# Patient Record
Sex: Female | Born: 1940 | Race: White | Hispanic: No | Marital: Married | Smoking: Former smoker
Health system: Southern US, Community
[De-identification: ages and names within clinical notes are randomized; demographics above are authoritative.]

## PROBLEM LIST (undated history)

## (undated) DIAGNOSIS — Z86010 Personal history of colonic polyps: Secondary | ICD-10-CM

## (undated) DIAGNOSIS — I10 Essential (primary) hypertension: Secondary | ICD-10-CM

## (undated) DIAGNOSIS — M2550 Pain in unspecified joint: Secondary | ICD-10-CM

## (undated) DIAGNOSIS — E079 Disorder of thyroid, unspecified: Secondary | ICD-10-CM

## (undated) DIAGNOSIS — H269 Unspecified cataract: Secondary | ICD-10-CM

## (undated) DIAGNOSIS — L120 Bullous pemphigoid: Secondary | ICD-10-CM

## (undated) HISTORY — DX: Disorder of thyroid, unspecified: E07.9

## (undated) HISTORY — DX: Unspecified cataract: H26.9

## (undated) HISTORY — DX: Essential (primary) hypertension: I10

## (undated) HISTORY — DX: Personal history of colonic polyps: Z86.010

## (undated) HISTORY — PX: RETINOPATHY SURGERY: SHX765

## (undated) HISTORY — DX: Pain in unspecified joint: M25.50

## (undated) HISTORY — DX: Bullous pemphigoid: L12.0

---

## 1979-03-24 HISTORY — PX: ABDOMINAL HYSTERECTOMY: SHX81

## 1980-03-23 HISTORY — PX: CHOLECYSTECTOMY: SHX55

## 2001-03-23 HISTORY — PX: CATARACT EXTRACTION: SUR2

## 2005-04-28 ENCOUNTER — Other Ambulatory Visit: Payer: Self-pay

## 2005-04-28 ENCOUNTER — Inpatient Hospital Stay: Payer: Self-pay | Admitting: Internal Medicine

## 2005-04-28 IMAGING — CR DG CHEST 1V PORT
1 series · 1 of 1 positions shown · non-contrast
Comparison: none

REASON FOR EXAM: Chest pain. Rm 19
COMMENTS:

PROCEDURE:     DXR - DXR PORTABLE CHEST SINGLE VIEW  - [DATE]  [DATE]
RESULT:     Portable view of the chest shows the heart is mildly enlarged.
Cardiac monitoring electrodes are present.  There is no infiltrate or
effusion.  There is no evidence of failure.

[view not recorded]
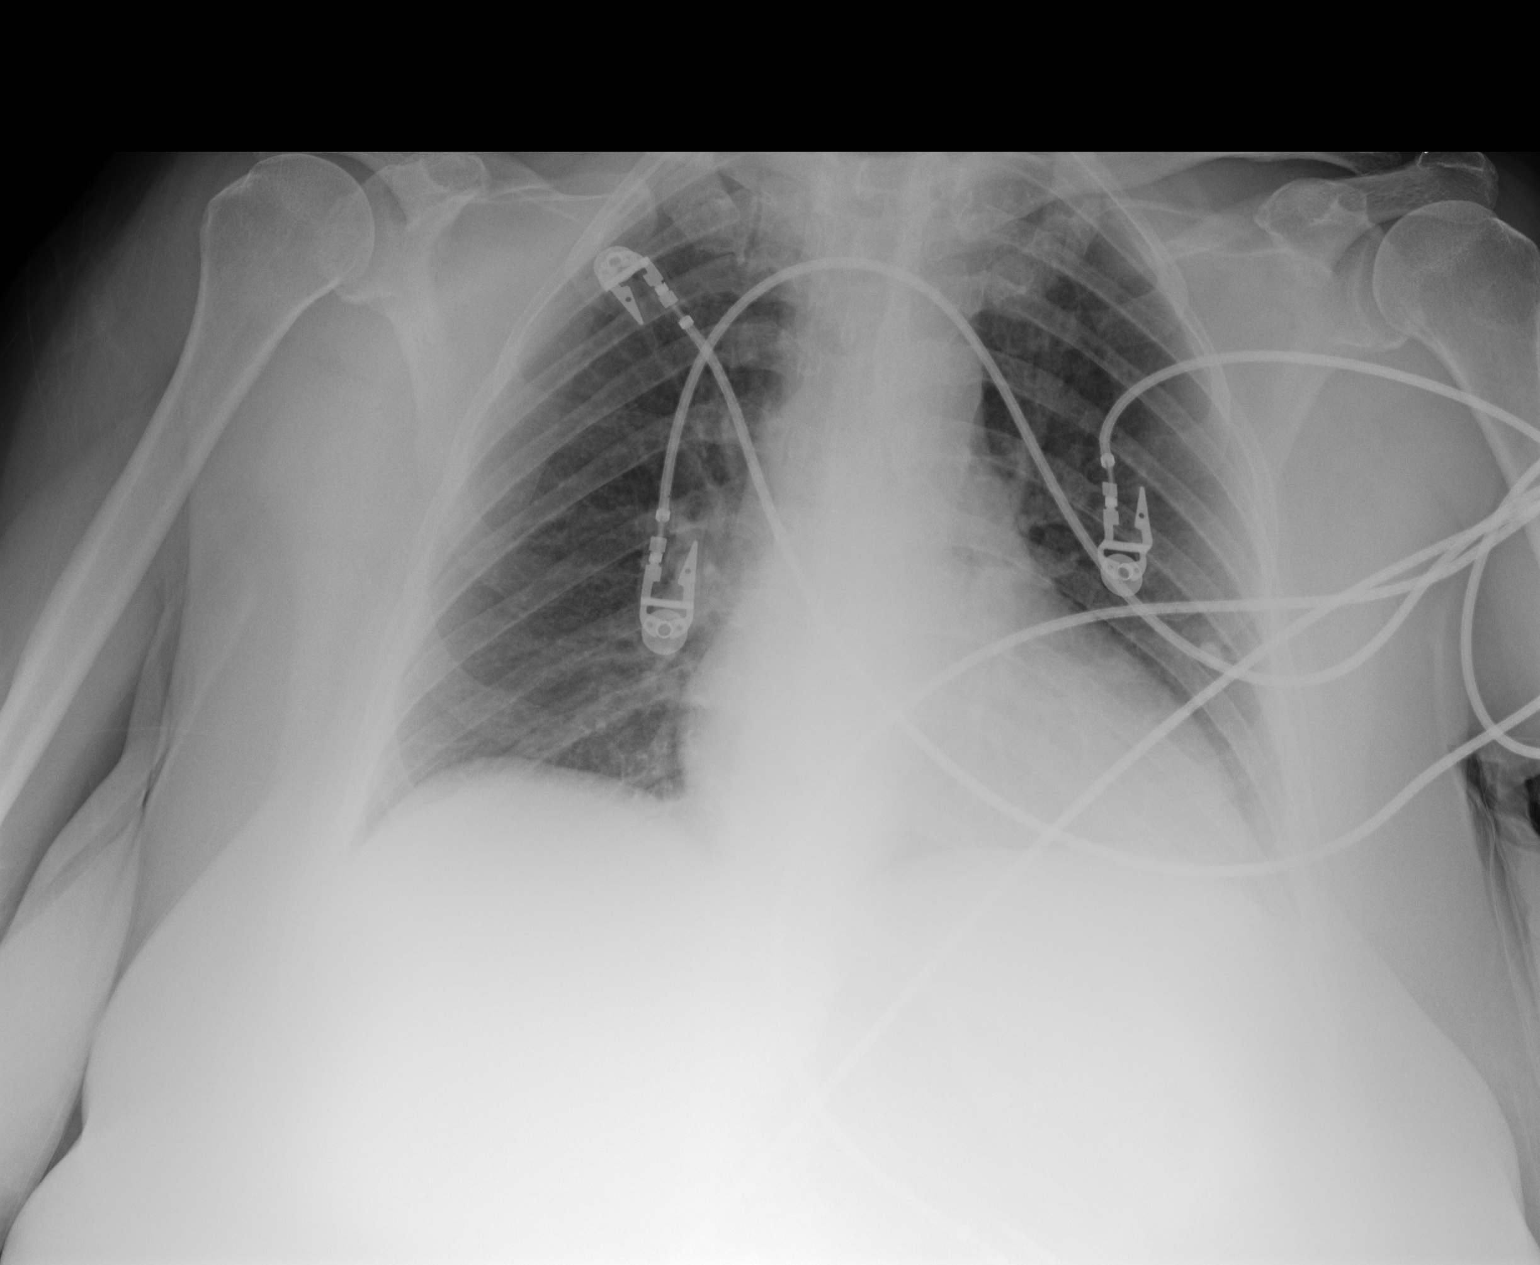

[1 of 1 positions shown; findings below may reference images not displayed]

IMPRESSION: No acute cardiopulmonary disease evident.

## 2005-10-27 ENCOUNTER — Ambulatory Visit: Payer: Self-pay | Admitting: Family Medicine

## 2009-02-20 ENCOUNTER — Ambulatory Visit: Payer: Self-pay | Admitting: Family Medicine

## 2011-11-19 ENCOUNTER — Ambulatory Visit (AMBULATORY_SURGERY_CENTER): Payer: Medicare Other | Admitting: *Deleted

## 2011-11-19 VITALS — Ht 64.0 in | Wt 178.7 lb

## 2011-11-19 DIAGNOSIS — Z1211 Encounter for screening for malignant neoplasm of colon: Secondary | ICD-10-CM

## 2011-11-19 MED ORDER — NA SULFATE-K SULFATE-MG SULF 17.5-3.13-1.6 GM/177ML PO SOLN: 1.0000 | Freq: Once | ORAL | Status: DC

## 2011-12-03 ENCOUNTER — Encounter: Payer: Self-pay | Admitting: *Deleted

## 2011-12-03 ENCOUNTER — Encounter: Payer: Self-pay | Admitting: Internal Medicine

## 2011-12-03 ENCOUNTER — Ambulatory Visit (AMBULATORY_SURGERY_CENTER): Payer: Medicare Other | Admitting: Internal Medicine

## 2011-12-03 VITALS — BP 151/75 | HR 51 | Temp 98.2°F | Resp 18 | Ht 64.0 in | Wt 178.0 lb

## 2011-12-03 DIAGNOSIS — K5520 Angiodysplasia of colon without hemorrhage: Secondary | ICD-10-CM

## 2011-12-03 DIAGNOSIS — Z1211 Encounter for screening for malignant neoplasm of colon: Secondary | ICD-10-CM

## 2011-12-03 DIAGNOSIS — Z860100 Personal history of colon polyps, unspecified: Secondary | ICD-10-CM

## 2011-12-03 DIAGNOSIS — D126 Benign neoplasm of colon, unspecified: Secondary | ICD-10-CM

## 2011-12-03 DIAGNOSIS — K5730 Diverticulosis of large intestine without perforation or abscess without bleeding: Secondary | ICD-10-CM

## 2011-12-03 DIAGNOSIS — K635 Polyp of colon: Secondary | ICD-10-CM

## 2011-12-03 HISTORY — DX: Personal history of colonic polyps: Z86.010

## 2011-12-03 HISTORY — DX: Personal history of colon polyps, unspecified: Z86.0100

## 2011-12-03 LAB — HM COLONOSCOPY

## 2011-12-03 MED ORDER — SODIUM CHLORIDE 0.9 % IV SOLN: 500.0000 mL | INTRAVENOUS | Status: DC

## 2011-12-03 NOTE — Progress Notes (Signed)
Patient did not have preoperative order for IV antibiotic SSI prophylaxis. (G8918)  Patient did not experience any of the following events: a burn prior to discharge; a fall within the facility; wrong site/side/patient/procedure/implant event; or a hospital transfer or hospital admission upon discharge from the facility. (G8907)  

## 2011-12-03 NOTE — Op Note (Addendum)
[facility] [address] [place], [address]   COLONOSCOPY PROCEDURE REPORT  PATIENT: Tracie Gray, Tracie Gray  MR#: [id number] BIRTHDATE: [birth year] , 70  yrs. old GENDER: Female ENDOSCOPIST: Iva Boop, MD, [facility] REFERRED XB:JYNWG Elease Hashimoto, M.D. PROCEDURE DATE:  12/03/2011 PROCEDURE:   Colonoscopy with snare polypectomy ASA CLASS:   Class III INDICATIONS:average risk screening. MEDICATIONS: propofol (Diprivan) 150mg  IV, MAC sedation, administered by CRNA, and These medications were titrated to patient response per physician's verbal order  DESCRIPTION OF PROCEDURE:   After the risks benefits and alternatives of the procedure were thoroughly explained, informed consent was obtained.  A digital rectal exam revealed no abnormalities of the rectum.   The LB CF-[id number]  endoscope was introduced through the anus and advanced to the cecum, which was identified by both the appendix and ileocecal valve. No adverse events experienced.   The quality of the prep was Suprep excellent The instrument was then slowly withdrawn as the colon was fully examined.      COLON FINDINGS: Four polypoid shaped sessile polyps measuring 3-12 mm in size were found at the cecum, in the transverse colon, and at the splenic flexure.  A polypectomy was performed with a cold snare and using snare cautery (12 mm splenic flexure polyp).  The resection was complete and the polyp tissue was completely retrieved.   There was severe diverticulosis noted in the sigmoid colon with associated luminal narrowing and muscular hypertrophy. 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 The colon mucosa was otherwise normal.  Retroflexed views revealed no abnormalities. The time to cecum=3 minutes 03 seconds. Withdrawal time=16 minutes 50 seconds.  The scope was withdrawn and the procedure completed. COMPLICATIONS: There were no complications.  ENDOSCOPIC IMPRESSION: 1.    Four sessile polyps measuring 3-12 mm in size were found at the cecum, in the transverse colon, and at the splenic flexure; polypectomy was performed with a cold snare and using snare cautery  2.   There was severe diverticulosis noted in the sigmoid colon 3.    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   The colon mucosa was otherwise normal with excellent prep   RECOMMENDATIONS: 1.  Hold aspirin, aspirin products, and anti-inflammatory medication for 2 weeks. 2.  Timing of repeat colonoscopy will be determined by pathology findings and health status in future   eSigned:  Iva Boop, MD, [facility] 12/03/2011 10:05 AM Revised: 12/03/2011 10:05 AM  cc: Lorie Phenix, MD and The Patient   PATIENT NAME:  Tracie Gray, Tracie Gray MR#: [id number]

## 2011-12-03 NOTE — Patient Instructions (Addendum)
The colonoscopy showed 4 polyps that were removed. They all look benign but will be analyzed by the pathologist.  You also have diverticulosis - common condition that usually does not cause problems.  There were also some small AVM's (arteriovenous malformations also called angiodysplasia) - in the colon. These were not bleeding and will probably never bother you. If they do they usually can be handled with colonoscopy treatment.  Please read the handouts.  Thank you for choosing me and [facility].  Iva Boop, MD, FACG   YOU HAD AN ENDOSCOPIC PROCEDURE TODAY AT THE [facility]: Refer to the procedure report that was given to you for any specific questions about what was found during the examination.  If the procedure report does not answer your questions, please call your gastroenterologist to clarify.  If you requested that your care partner not be given the details of your procedure findings, then the procedure report has been included in a sealed envelope for you to review at your convenience later.  YOU SHOULD EXPECT: Some feelings of bloating in the abdomen. Passage of more gas than usual.  Walking can help get rid of the air that was put into your GI tract during the procedure and reduce the bloating. If you had a lower endoscopy (such as a colonoscopy or flexible sigmoidoscopy) you may notice spotting of blood in your stool or on the toilet paper. If you underwent a bowel prep for your procedure, then you may not have a normal bowel movement for a few days.  DIET: Your first meal following the procedure should be a light meal and then it is ok to progress to your normal diet.  A half-sandwich or bowl of soup is an example of a good first meal.  Heavy or fried foods are harder to digest and may make you feel nauseous or bloated.  Likewise meals heavy in dairy and vegetables can cause extra gas to form and this can also increase the bloating.  Drink plenty of  fluids but you should avoid alcoholic beverages for 24 hours.  ACTIVITY: Your care partner should take you home directly after the procedure.  You should plan to take it easy, moving slowly for the rest of the day.  You can resume normal activity the day after the procedure however you should NOT DRIVE or use heavy machinery for 24 hours (because of the sedation medicines used during the test).    SYMPTOMS TO REPORT IMMEDIATELY: A gastroenterologist can be reached at any hour.  During normal business hours, 8:30 AM to 5:00 PM Monday through Friday, call [phone].  After hours and on weekends, please call the GI answering service at [phone] who will take a message and have the physician on call contact you.   Following lower endoscopy (colonoscopy or flexible sigmoidoscopy):  Excessive amounts of blood in the stool  Significant tenderness or worsening of abdominal pains  Swelling of the abdomen that is new, acute  Fever of 100F or higher  FOLLOW UP: If any biopsies were taken you will be contacted by phone or by letter within the next 1-3 weeks.  Call your gastroenterologist if you have not heard about the biopsies in 3 weeks.  Our staff will call the home number listed on your records the next business day following your procedure to check on you and address any questions or concerns that you may have at that time regarding the information given to you following your procedure. This  is a courtesy call and so if there is no answer at the home number and we have not heard from you through the emergency physician on call, we will assume that you have returned to your regular daily activities without incident.  SIGNATURES/CONFIDENTIALITY: You and/or your care partner have signed paperwork which will be entered into your electronic medical record.  These signatures attest to the fact that that the information above on your After Visit Summary has been reviewed and is understood.  Full  responsibility of the confidentiality of this discharge information lies with you and/or your care-partner.    HANDOUTS ON POLYPS, DIVERTICULOSIS, HIGH FIBER DIET  NO ASPIRIN, ASPIRIN CONTAINING MEDICINES, NSAIDS LIKE MOTRIN, ADVIL ALEVE FOR 2 WEEKS. TYLENOL ONLY AS NEEDED FOR PAIN. MAY RESUME THE BABY ASPIRIN ON 12-17-11

## 2011-12-04 ENCOUNTER — Telehealth: Payer: Self-pay

## 2011-12-04 NOTE — Telephone Encounter (Signed)
Left a message at [phone] for the pt to call if any questions or concerns. Maw

## 2011-12-08 ENCOUNTER — Encounter: Payer: Self-pay | Admitting: Internal Medicine

## 2011-12-08 NOTE — Progress Notes (Signed)
Quick Note:  4 tubular adenomas max 12 mm Repeat colon about 11/2014 ______

## 2013-06-06 ENCOUNTER — Telehealth: Payer: Self-pay | Admitting: Pulmonary Disease

## 2013-06-06 ENCOUNTER — Ambulatory Visit: Payer: Self-pay | Admitting: Family Medicine

## 2013-06-06 LAB — CBC AND DIFFERENTIAL: WBC: 6.5 10^3/mL

## 2013-06-06 NOTE — Telephone Encounter (Signed)
Spoke with sarah. appt scheduled for 06/27/13. Nothing further needed

## 2013-06-27 ENCOUNTER — Encounter: Payer: Self-pay | Admitting: Pulmonary Disease

## 2013-06-27 ENCOUNTER — Ambulatory Visit (INDEPENDENT_AMBULATORY_CARE_PROVIDER_SITE_OTHER): Payer: Commercial Managed Care - HMO | Admitting: Pulmonary Disease

## 2013-06-27 VITALS — BP 136/76 | HR 58 | Ht 62.5 in | Wt 170.0 lb

## 2013-06-27 DIAGNOSIS — R0602 Shortness of breath: Secondary | ICD-10-CM

## 2013-06-27 NOTE — Patient Instructions (Signed)
We will request a copy of your clinic visit and echocardiogram from Dr. Hortense Ramal office We will set up a pulmonary function test at [facility] Call Dr. Sharyon Medicus office to discuss your blood sugar If all tests are normal then start exercising regularly. We will see you back in 4-6 weeks or sooner if needed

## 2013-06-27 NOTE — Assessment & Plan Note (Signed)
Today her lung exam and her ambulatory oximetry were completely normal. Based on this I don't see clear evidence of lung disease. I will get a chest x-ray and full pulmonary function test to further evaluate. Am happy that her recent hemoglobin was normal.  It sounds like her recent cardiac workup was also normal. We will need to get workup for this.  Her neurologic exam is normal and so that is also unlikely to explain her shortness of breath.  In summary, at this point and finding little evidence to explain her shortness of breath other than deconditioning and being overweight.  Plan:  -full pulmonary function testing -chest x-ray  -get cardiology records -Followup 4 weeks

## 2013-06-27 NOTE — Progress Notes (Signed)
Subjective:    Patient ID: Tracie Gray, female    DOB: 02/24/1941, 73 y.o.   MRN: [id number]  HPI  06/27/2013 Initial visit >>  Ms. Tracie Gray is a 73 y/o female here to see [place] today for shortness of breath.  She has been seen by her primary care physician for this recently.  She said that she noted some ankle swelling lately and so she saw her PCP.  On that visit she was noted to have a low oxygen saturation.  Notably her ankle swelling had impreoved by that visit.  She states that the problem comes and goes and doesn't bother her as much as it bothers her husband.  It was more of a problem when she was visiting family in [place] last summer.    She has noted dyspnea on exertion with cleaning the house, walking to the mailbox.  She has noted that her energy level is lower than before and she has to take a break often.  Walking outside brings on her dyspnea predictably.  She notes that she can only really walk about 40-50 feet without dyspnea.  Her husband has started doing more vigorous house work and shopping trips for her because of this.  She has noted the problem for several months and just attributed it to age.    She denies chest pain.  She will rarely have chest tightness.  She notes that she had a stress test in the last 6 months which was normal.  She sees Dr. Satira Anis for this.  She denies cough.    She has never been told that she had a lung problem before.  She was noted to have scarring on her lungs on a recent chest X-ray which she attributes to a bad spell of bronchitis about 7-8 years ago.  Childhood was normal without respiratory problems.  She smoked cigarettes for a few months after marriage and before her daughters.    Past Medical History  Diagnosis Date  . Arthritic-like pain   . Bilateral cataracts   . Hypertension   . Thyroid disease   . 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 Personal history of colonic polyps-adenomas 12/03/2011      Family History  Problem Relation Age of Onset  . Colon cancer Neg Hx   . Rectal cancer Neg Hx   . Stomach cancer Neg Hx   . Esophageal cancer Neg Hx      History   Social History  . Marital Status: Married    Spouse Name: N/A    Number of Children: N/A  . Years of Education: N/A   Occupational History  . Not on file.   Social History Main Topics  . Smoking status: Former Smoker -- 1.00 packs/day for 1 years    Types: Cigarettes    Quit date: 03/23/1962  . Smokeless tobacco: Never Used  . Alcohol Use: No  . Drug Use: No  . Sexual Activity: Not on file   Other Topics Concern  . Not on file   Social History Narrative  . No narrative on file     Allergies  Allergen Reactions  . Calcium Channel Blockers Rash  . Norvasc [Amlodipine Besylate] Rash  . Penicillins Rash     Outpatient Prescriptions Prior to Visit  Medication Sig Dispense Refill  . aspirin 81 MG tablet Take 81 mg by mouth daily.      Marland Kitchen atenolol (TENORMIN) 25 MG tablet Take  25 mg by mouth 2 (two) times daily.       . cloNIDine (CATAPRES) 0.1 MG tablet Take 0.1 mg by mouth 2 (two) times daily.       . hydrochlorothiazide (HYDRODIURIL) 25 MG tablet Take 25 mg by mouth daily.        No facility-administered medications prior to visit.      Review of Systems  Constitutional: Negative for fever and unexpected weight change.  HENT: Positive for sore throat. Negative for congestion, dental problem, ear pain, nosebleeds, postnasal drip, rhinorrhea, sinus pressure, sneezing and trouble swallowing.   Eyes: Negative for redness and itching.  Respiratory: Positive for shortness of breath and wheezing. Negative for cough and chest tightness.   Cardiovascular: Positive for leg swelling. Negative for palpitations.  Gastrointestinal: Negative for nausea and vomiting.  Genitourinary: Negative for dysuria.  Musculoskeletal: Negative for joint swelling.  Skin: Negative for rash.  Neurological: Negative for  headaches.  Hematological: Does not bruise/bleed easily.  Psychiatric/Behavioral: Negative for dysphoric mood. The patient is not nervous/anxious.        Objective:   Physical Exam Filed Vitals:   06/27/13 0913  BP: 136/76  Pulse: 58  Height: 5' 2.5" (1.588 m)  Weight: 170 lb (77.111 kg)  SpO2: 97%   Ambulated 500 feet RA and O2 saturation did no drop below 95%  Gen: well appearing, no acute distress HEENT: NCAT, PERRL, EOMi, OP clear, neck supple without masses PULM: CTA B CV: RRR, no mgr, no JVD AB: BS+, soft, nontender, no hsm Ext: warm, no edema, no clubbing, no cyanosis Derm: no rash or skin breakdown Neuro: A&Ox4, CN II-XII intact, strength 5/5 in all 4 extremities        Assessment & Plan:   Shortness of breath Today her lung exam and her ambulatory oximetry were completely normal. Based on this I don't see clear evidence of lung disease. I will get a chest x-ray and full pulmonary function test to further evaluate. Am happy that her recent hemoglobin was normal.  It sounds like her recent cardiac workup was also normal. We will need to get workup for this.  Her neurologic exam is normal and so that is also unlikely to explain her shortness of breath.  In summary, at this point and finding little evidence to explain her shortness of breath other than deconditioning and being overweight.  Plan:  -full pulmonary function testing -chest x-ray  -get cardiology records -Followup 4 weeks   Updated Medication List Outpatient Encounter Prescriptions as of 06/27/2013  Medication Sig  . aspirin 81 MG tablet Take 81 mg by mouth daily.  Marland Kitchen atenolol (TENORMIN) 25 MG tablet Take 25 mg by mouth 2 (two) times daily.   . Cholecalciferol (VITAMIN D-3) 5000 UNITS TABS Take 1 tablet by mouth every 7 (seven) days.  . clobetasol cream (TEMOVATE) 9.21 % Apply 1 application topically 2 (two) times daily.  . cloNIDine (CATAPRES) 0.1 MG tablet Take 0.1 mg by mouth 2 (two) times  daily.   . hydrochlorothiazide (HYDRODIURIL) 25 MG tablet Take 25 mg by mouth daily.

## 2013-07-04 ENCOUNTER — Encounter: Payer: Self-pay | Admitting: Pulmonary Disease

## 2013-07-11 ENCOUNTER — Ambulatory Visit: Payer: Self-pay | Admitting: Pulmonary Disease

## 2013-07-11 LAB — PULMONARY FUNCTION TEST

## 2013-07-17 ENCOUNTER — Telehealth: Payer: Self-pay

## 2013-07-17 ENCOUNTER — Encounter: Payer: Self-pay | Admitting: Pulmonary Disease

## 2013-07-17 DIAGNOSIS — R0602 Shortness of breath: Secondary | ICD-10-CM

## 2013-07-17 NOTE — Telephone Encounter (Signed)
Message copied by Len Blalock on Mon Jul 17, 2013  1:31 PM ------      Message from: Juanito Doom      Created: Tue Jun 27, 2013 10:34 AM       A,            Please call her in a month to remind her to get blood work at our office on May 4            Thanks      B ------

## 2013-07-17 NOTE — Telephone Encounter (Signed)
Dr. Lake Bells, This patient doesn't have any future orders for labs.  What labs do I need to put in for her to have drawn the day before her visit?  Thanks, Caryl Pina

## 2013-07-18 ENCOUNTER — Encounter: Payer: Self-pay | Admitting: Pulmonary Disease

## 2013-07-18 NOTE — Telephone Encounter (Signed)
Cbc, thanks

## 2013-07-18 NOTE — Telephone Encounter (Signed)
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   Thanks  B   ---------------------------------------------------   Lab has been ordered, pt has been reminded to have that drawn a couple of days prior to her visit.  She is also aware of her pft results.  Nothing further at this time.

## 2013-07-21 ENCOUNTER — Other Ambulatory Visit (INDEPENDENT_AMBULATORY_CARE_PROVIDER_SITE_OTHER): Payer: Commercial Managed Care - HMO

## 2013-07-21 DIAGNOSIS — R0602 Shortness of breath: Secondary | ICD-10-CM

## 2013-07-21 LAB — CBC WITH DIFFERENTIAL/PLATELET
BASOS PCT: 0.4 % (ref 0.0–3.0)
Basophils Absolute: 0 10*3/uL (ref 0.0–0.1)
EOS PCT: 5 % (ref 0.0–5.0)
Eosinophils Absolute: 0.4 10*3/uL (ref 0.0–0.7)
HEMATOCRIT: 40.1 % (ref 36.0–46.0)
HEMOGLOBIN: 13.6 g/dL (ref 12.0–15.0)
LYMPHS ABS: 2.8 10*3/uL (ref 0.7–4.0)
Lymphocytes Relative: 39.1 % (ref 12.0–46.0)
MCHC: 33.9 g/dL (ref 30.0–36.0)
MCV: 98 fl (ref 78.0–100.0)
MONOS PCT: 5.3 % (ref 3.0–12.0)
Monocytes Absolute: 0.4 10*3/uL (ref 0.1–1.0)
NEUTROS ABS: 3.6 10*3/uL (ref 1.4–7.7)
Neutrophils Relative %: 50.2 % (ref 43.0–77.0)
Platelets: 243 10*3/uL (ref 150.0–400.0)
RBC: 4.1 Mil/uL (ref 3.87–5.11)
RDW: 14.2 % (ref 11.5–14.6)
WBC: 7.1 10*3/uL (ref 4.5–10.5)

## 2013-07-25 ENCOUNTER — Encounter: Payer: Self-pay | Admitting: Pulmonary Disease

## 2013-07-25 ENCOUNTER — Ambulatory Visit (INDEPENDENT_AMBULATORY_CARE_PROVIDER_SITE_OTHER): Payer: Commercial Managed Care - HMO | Admitting: Pulmonary Disease

## 2013-07-25 VITALS — BP 138/62 | HR 59 | Ht 63.5 in | Wt 180.0 lb

## 2013-07-25 DIAGNOSIS — R0602 Shortness of breath: Secondary | ICD-10-CM

## 2013-07-25 NOTE — Progress Notes (Signed)
   Subjective:    Patient ID: Tracie Gray, female    DOB: March 10, 1941, 73 y.o.   MRN: [id number]  Synopsis: Referred to [facility] pulmonary in 2015 for shortness of breath. Never smoked cigarettes, never given a previous diagnosis of lung disease. 01/2013 Nuclear Stress Test> Normal treadmill test without evidence of ischemia or dysrhythmia; LVEF 68% 01/2013 Echo > LVEF 60%, limited views; RV size and function normal, normal atria 06/2013 PFT > Ratio 84%, FEV1 1.70 L (92% pred, -5% change with BD), TLC 3.48L (76% pred), ERV 0.11 (12% pred), DLCO 13.1 (65% pred)  Dyspnea was felt to be due to deconditioning and obesity.  HPI  07/25/2013 ROV > Since the last visit Tracie Gray has contineud to experience soe shortness of breath.  It hasn't really worsened since the last visit.  She as ablee to garden with her husband last weekend wihtout too much trouble.  Rare cough.  She has been trying to exercise a little more.  She really hsn't been too active with her hobbies as they are all fairly sedentary.  She has been active withwork around the house however.   Past Medical History  Diagnosis Date  . Arthritic-like pain   . Bilateral cataracts   . Hypertension   . Thyroid disease   . 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 Personal history of colonic polyps-adenomas 12/03/2011     Review of Systems     Objective:   Physical Exam  Filed Vitals:   07/25/13 1045  BP: 138/62  Pulse: 59  Height: 5' 3.5" (1.613 m)  Weight: 180 lb (81.647 kg)  SpO2: 100%   RA  Gen: well appearing, no acute distress HEENT: NCAT,  EOMi, OP clear,  PULM: CTA B CV: RRR, no mgr, no JVD AB: BS+, soft, nontender, no hsm Ext: warm, no edema, no clubbing, no cyanosis Derm: no rash or skin breakdown        Assessment & Plan:   Shortness of breath Her pulmonary function testing only showed mild her sugar disease which was consistent with obesity. The slight decrease in  her diffusion capacity is consistent with this restriction.  So in summary, I cannot find evidence for lung disease. Considering her normal cardiac workup I think it would be safe and is strongly encouraged for her to start exercising on a regular basis. The most likely etiology for her shortness of breath is deconditioning.  Plan: -Encourage regular exercise -Followup with me and shortness of breath worsens    Updated Medication List Outpatient Encounter Prescriptions as of 07/25/2013  Medication Sig  . aspirin 81 MG tablet Take 81 mg by mouth daily.  Marland Kitchen atenolol (TENORMIN) 25 MG tablet Take 25 mg by mouth 2 (two) times daily.   . Cholecalciferol (VITAMIN D-3) 5000 UNITS TABS Take 1 tablet by mouth every 7 (seven) days.  . clobetasol cream (TEMOVATE) 5.36 % Apply 1 application topically 2 (two) times daily.  . cloNIDine (CATAPRES) 0.1 MG tablet Take 0.1 mg by mouth 2 (two) times daily.   . hydrochlorothiazide (HYDRODIURIL) 25 MG tablet Take 25 mg by mouth daily.

## 2013-07-25 NOTE — Patient Instructions (Signed)
Exercise regularly.  Start with small goals (ten minutes a day) and increase to 25 minutes a day We will see you back if the breathing problem worsens

## 2013-07-25 NOTE — Assessment & Plan Note (Signed)
Her pulmonary function testing only showed mild her sugar disease which was consistent with obesity. The slight decrease in her diffusion capacity is consistent with this restriction.  So in summary, I cannot find evidence for lung disease. Considering her normal cardiac workup I think it would be safe and is strongly encouraged for her to start exercising on a regular basis. The most likely etiology for her shortness of breath is deconditioning.  Plan: -Encourage regular exercise -Followup with me and shortness of breath worsens

## 2013-08-30 ENCOUNTER — Encounter: Payer: Self-pay | Admitting: Pulmonary Disease

## 2013-11-30 ENCOUNTER — Ambulatory Visit: Payer: Self-pay | Admitting: Family Medicine

## 2013-11-30 LAB — HM DEXA SCAN

## 2013-11-30 LAB — HM MAMMOGRAPHY

## 2013-12-07 ENCOUNTER — Ambulatory Visit: Payer: Self-pay | Admitting: Family Medicine

## 2013-12-21 ENCOUNTER — Ambulatory Visit: Payer: Self-pay | Admitting: Family Medicine

## 2013-12-27 LAB — TSH: TSH: 1.74 u[IU]/mL (ref <=5.90)

## 2013-12-27 LAB — HEPATIC FUNCTION PANEL
ALT: 23 U/L (ref 7–35)
AST: 21 U/L (ref 13–35)

## 2013-12-27 LAB — BASIC METABOLIC PANEL
BUN: 14 mg/dL (ref 4–21)
CREATININE: 1.2 mg/dL — AB (ref <=1.1)
Glucose: 119 mg/dL
POTASSIUM: 3.6 mmol/L (ref 3.4–5.3)
SODIUM: 142 mmol/L (ref 137–147)

## 2014-01-21 ENCOUNTER — Ambulatory Visit: Payer: Self-pay | Admitting: Family Medicine

## 2014-02-01 DIAGNOSIS — E1122 Type 2 diabetes mellitus with diabetic chronic kidney disease: Secondary | ICD-10-CM | POA: Insufficient documentation

## 2014-02-01 DIAGNOSIS — E119 Type 2 diabetes mellitus without complications: Secondary | ICD-10-CM | POA: Insufficient documentation

## 2014-03-30 LAB — HEMOGLOBIN A1C: HEMOGLOBIN A1C: 6 % (ref 4.0–6.0)

## 2014-07-27 DIAGNOSIS — R609 Edema, unspecified: Secondary | ICD-10-CM | POA: Insufficient documentation

## 2014-07-27 DIAGNOSIS — I10 Essential (primary) hypertension: Secondary | ICD-10-CM | POA: Insufficient documentation

## 2014-07-27 DIAGNOSIS — I129 Hypertensive chronic kidney disease with stage 1 through stage 4 chronic kidney disease, or unspecified chronic kidney disease: Secondary | ICD-10-CM | POA: Insufficient documentation

## 2014-07-27 DIAGNOSIS — E559 Vitamin D deficiency, unspecified: Secondary | ICD-10-CM | POA: Insufficient documentation

## 2014-07-27 DIAGNOSIS — L120 Bullous pemphigoid: Secondary | ICD-10-CM | POA: Insufficient documentation

## 2014-07-27 DIAGNOSIS — R7981 Abnormal blood-gas level: Secondary | ICD-10-CM | POA: Insufficient documentation

## 2014-07-27 DIAGNOSIS — N183 Chronic kidney disease, stage 3 unspecified: Secondary | ICD-10-CM | POA: Insufficient documentation

## 2014-07-27 DIAGNOSIS — G64 Other disorders of peripheral nervous system: Secondary | ICD-10-CM | POA: Insufficient documentation

## 2014-07-27 DIAGNOSIS — G629 Polyneuropathy, unspecified: Secondary | ICD-10-CM | POA: Insufficient documentation

## 2014-09-04 ENCOUNTER — Encounter: Payer: Self-pay | Admitting: *Deleted

## 2014-09-28 ENCOUNTER — Ambulatory Visit (INDEPENDENT_AMBULATORY_CARE_PROVIDER_SITE_OTHER): Payer: PPO | Admitting: Family Medicine

## 2014-09-28 ENCOUNTER — Encounter: Payer: Self-pay | Admitting: Family Medicine

## 2014-09-28 VITALS — BP 128/70 | HR 56 | Temp 97.6°F | Resp 16 | Ht 64.5 in | Wt 164.0 lb

## 2014-09-28 DIAGNOSIS — E559 Vitamin D deficiency, unspecified: Secondary | ICD-10-CM

## 2014-09-28 DIAGNOSIS — E785 Hyperlipidemia, unspecified: Secondary | ICD-10-CM | POA: Insufficient documentation

## 2014-09-28 DIAGNOSIS — E7800 Pure hypercholesterolemia, unspecified: Secondary | ICD-10-CM

## 2014-09-28 DIAGNOSIS — N183 Chronic kidney disease, stage 3 unspecified: Secondary | ICD-10-CM

## 2014-09-28 DIAGNOSIS — I129 Hypertensive chronic kidney disease with stage 1 through stage 4 chronic kidney disease, or unspecified chronic kidney disease: Secondary | ICD-10-CM | POA: Diagnosis not present

## 2014-09-28 DIAGNOSIS — I10 Essential (primary) hypertension: Secondary | ICD-10-CM | POA: Diagnosis not present

## 2014-09-28 DIAGNOSIS — E119 Type 2 diabetes mellitus without complications: Secondary | ICD-10-CM

## 2014-09-28 LAB — POCT GLYCOSYLATED HEMOGLOBIN (HGB A1C): HEMOGLOBIN A1C: 5.8

## 2014-09-28 MED ORDER — HYDROCHLOROTHIAZIDE 25 MG PO TABS: 25.0000 mg | ORAL_TABLET | Freq: Every day | ORAL | Status: DC

## 2014-09-28 NOTE — Progress Notes (Signed)
Subjective:    Patient ID: Tracie Gray, female    DOB: March 06, 1941, 74 y.o.   MRN: [id number]  Hypertension This is a chronic problem. The problem is unchanged. The problem is controlled (Pt checks her BP at home occasionally.  It usually runs around 140's/ 80's). Pertinent negatives include no blurred vision, chest pain, headaches, malaise/fatigue, neck pain, palpitations, peripheral edema, PND or shortness of breath. Risk factors for coronary artery disease include diabetes mellitus and dyslipidemia.  Diabetes She presents for her follow-up diabetic visit. She has type 2 diabetes mellitus. Her disease course has been stable (Last A1C was 03/30/2014:  6.0%). There are no hypoglycemic associated symptoms. Pertinent negatives for hypoglycemia include no dizziness, headaches, seizures, speech difficulty or tremors. Associated symptoms include foot paresthesias. Pertinent negatives for diabetes include no blurred vision, no chest pain, no fatigue, no foot ulcerations, no polydipsia, no polyphagia, no polyuria, no visual change, no weakness and no weight loss. Symptoms are stable. Risk factors for coronary artery disease include diabetes mellitus, dyslipidemia and hypertension. She is compliant with treatment all of the time. Her weight is stable. There is no change in her home blood glucose trend. Her overall blood glucose range is 130-140 mg/dl. She does not see a podiatrist.Eye exam is not current (May have been a year ago.  ).  Hyperlipidemia This is a chronic problem. The problem is controlled. Recent lipid tests were reviewed and are high (Last Cholesterol was 10/11/2013, Total Cholesterol:  202; Tri:  192; HDL:  44; LDL:  120.). Associated symptoms include myalgias (Feet hurt occasionally.). Pertinent negatives include no chest pain or shortness of breath. There are no compliance problems.  Risk factors for coronary artery disease include diabetes mellitus and hypertension.    Patient Active  Problem List   Diagnosis Date Noted  . Elevated LDL cholesterol level 09/28/2014  . Benign hypertension 07/27/2014  . Adult BMI 30+ 07/27/2014  . ABG (arterial blood gas) abnormal 07/27/2014  . 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 Chronic kidney disease (CKD), stage III (moderate) 07/27/2014  . Diabetes 07/27/2014  . Accumulation of fluid in tissues 07/27/2014  . Vitamin D deficiency 07/27/2014  . Neuropathy 07/27/2014  . 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 Shortness of breath 06/27/2013  . Personal history of colonic polyps-adenomas 12/03/2011  . Angiodysplasia of cecum 12/03/2011   Family History  Problem Relation Age of Onset  . Colon cancer Neg Hx   . Rectal cancer Neg Hx   . Stomach cancer Neg Hx   . Esophageal cancer Neg Hx   . Cancer Mother   . Coronary artery disease Father   . Heart disease Father   . Heart attack Father   . Heart attack Paternal Grandfather    History   Social History  . Marital Status: Married    Spouse Name: Fritz Pickerel  . Number of Children: 3  . Years of Education: N/A   Occupational History  . Not on file.   Social History Main Topics  . Smoking status: Former Smoker -- 1.00 packs/day for 1 years    Types: Cigarettes    Quit date: 03/23/1962  . Smokeless tobacco: Never Used  . Alcohol Use: No  . Drug Use: No  . Sexual Activity: Not on file   Other Topics Concern  . Not on file   Social History Narrative   Past Surgical History  Procedure Laterality Date  . Cataract extraction Bilateral 2003  . Cholecystectomy  1982  .  Abdominal hysterectomy  1981  . Cesarean section  1981  . Retinopathy surgery Right    Allergies  Allergen Reactions  . Calcium Channel Blockers Rash  . Norvasc [Amlodipine Besylate] Rash  . Penicillins Rash   Previous Medications   ASPIRIN 81 MG TABLET    Take 81 mg by mouth daily.   ATENOLOL (TENORMIN) 25 MG TABLET    Take by mouth.   CHOLECALCIFEROL (VITAMIN D-3) 5000 UNITS TABS    Take  1 tablet by mouth every 7 (seven) days.   CLOBETASOL CREAM (TEMOVATE) 0.05 %    Apply 1 application topically 2 (two) times daily.   CLONIDINE (CATAPRES) 0.1 MG TABLET    Take 0.1 mg by mouth 2 (two) times daily.    FUROSEMIDE (LASIX) 20 MG TABLET    Take by mouth.   HYDROCHLOROTHIAZIDE (HYDRODIURIL) 25 MG TABLET    Take 25 mg by mouth daily.    POTASSIUM CHLORIDE (MICRO-K) 10 MEQ CR CAPSULE    Take by mouth.   BP 128/70 mmHg  Pulse 56  Temp(Src) 97.6 F (36.4 C) (Oral)  Resp 16  Ht 5' 4.5" (1.638 m)  Wt 164 lb (74.39 kg)  BMI 27.73 kg/m2    Review of Systems  Constitutional: Negative for fever, chills, weight loss, malaise/fatigue, diaphoresis, activity change, appetite change, fatigue and unexpected weight change.  Eyes: Negative for blurred vision.  Respiratory: Negative for apnea, cough, choking, chest tightness, shortness of breath, wheezing and stridor.   Cardiovascular: Negative for chest pain, palpitations, leg swelling and PND.  Gastrointestinal: Positive for constipation (Chronic Issue). Negative for nausea, vomiting, abdominal pain, diarrhea, blood in stool, abdominal distention, anal bleeding and rectal pain.  Endocrine: Negative.  Negative for polydipsia, polyphagia and polyuria.  Musculoskeletal: Positive for myalgias (Feet hurt occasionally.). Negative for back pain, joint swelling, arthralgias, gait problem, neck pain and neck stiffness.  Neurological: Positive for numbness (Feet are always numb, and occasionally her hands as well.). Negative for dizziness, tremors, seizures, syncope, speech difficulty, weakness, light-headedness and headaches.       Objective:   Physical Exam  Constitutional: She is oriented to person, place, and time. She appears well-developed and well-nourished.  Cardiovascular: Normal rate and regular rhythm.   Pulmonary/Chest: Effort normal and breath sounds normal.  Neurological: She is alert and oriented to person, place, and time.   Psychiatric: She has a normal mood and affect. Her behavior is normal. Thought content normal.   Diabetic Foot Exam - Simple   Simple Foot Form  Diabetic Foot exam was performed with the following findings:  Yes 09/28/2014 12:05 PM  Visual Inspection  No deformities, no ulcerations, no other skin breakdown bilaterally:  Yes  Sensation Testing  See comments:  Yes  Pulse Check  Posterior Tibialis and Dorsalis pulse intact bilaterally:  Yes  Comments  Decreased pinprick in feet bilaterally. Monofilament abnormal.      BP 128/70 mmHg  Pulse 56  Temp(Src) 97.6 F (36.4 C) (Oral)  Resp 16  Ht 5' 4.5" (1.638 m)  Wt 164 lb (74.39 kg)  BMI 27.73 kg/m2       Assessment & Plan:   1. Benign hypertension Will stop Clonidine.  - CBC with Differential/Platelet - TSH - hydrochlorothiazide (HYDRODIURIL) 25 MG tablet; Take 1 tablet (25 mg total) by mouth daily.  Dispense: 90 tablet; Refill: 3  2. Type 2 diabetes mellitus without complication Stable. Continue current lifestyle changes. Recheck in 3 months. Doing very well.  - POCT glycosylated hemoglobin (  Hb A1C) - Comprehensive metabolic panel Results for orders placed or performed in visit on 09/28/14  POCT glycosylated hemoglobin (Hb A1C)  Result Value Ref Range   Hemoglobin A1C 5.8     3. Elevated LDL cholesterol level Check labs.  - Lipid panel  4. Vitamin D deficiency Will check labs.  - Vit D  25 hydroxy (rtn osteoporosis monitoring)  5. Benign hypertension with chronic kidney disease, stage III Stable. Will stop Clonidine and check check labs.   Margarita Rana, MD

## 2014-10-05 ENCOUNTER — Telehealth: Payer: Self-pay

## 2014-10-05 LAB — COMPREHENSIVE METABOLIC PANEL
ALT: 17 IU/L (ref 0–32)
AST: 20 IU/L (ref 0–40)
Albumin/Globulin Ratio: 1.5 (ref 1.1–2.5)
Albumin: 3.9 g/dL (ref 3.5–4.8)
Alkaline Phosphatase: 42 IU/L (ref 39–117)
BILIRUBIN TOTAL: 0.6 mg/dL (ref 0.0–1.2)
BUN/Creatinine Ratio: 15 (ref 11–26)
BUN: 17 mg/dL (ref 8–27)
CALCIUM: 9.9 mg/dL (ref 8.7–10.3)
CHLORIDE: 100 mmol/L (ref 97–108)
CO2: 30 mmol/L — ABNORMAL HIGH (ref 18–29)
CREATININE: 1.12 mg/dL — AB (ref 0.57–1.00)
GFR calc Af Amer: 56 mL/min/{1.73_m2} — ABNORMAL LOW (ref >59)
GFR, EST NON AFRICAN AMERICAN: 49 mL/min/{1.73_m2} — AB (ref >59)
GLOBULIN, TOTAL: 2.6 g/dL (ref 1.5–4.5)
Glucose: 134 mg/dL — ABNORMAL HIGH (ref 65–99)
Potassium: 4.6 mmol/L (ref 3.5–5.2)
Sodium: 143 mmol/L (ref 134–144)
Total Protein: 6.5 g/dL (ref 6.0–8.5)

## 2014-10-05 LAB — CBC WITH DIFFERENTIAL/PLATELET
BASOS: 0 %
Basophils Absolute: 0 10*3/uL (ref 0.0–0.2)
EOS (ABSOLUTE): 0.2 10*3/uL (ref 0.0–0.4)
EOS: 3 %
Hematocrit: 40.4 % (ref 34.0–46.6)
Hemoglobin: 13.7 g/dL (ref 11.1–15.9)
Immature Grans (Abs): 0 10*3/uL (ref 0.0–0.1)
Immature Granulocytes: 0 %
LYMPHS ABS: 1.9 10*3/uL (ref 0.7–3.1)
Lymphs: 27 %
MCH: 31.7 pg (ref 26.6–33.0)
MCHC: 33.9 g/dL (ref 31.5–35.7)
MCV: 94 fL (ref 79–97)
MONOCYTES: 6 %
MONOS ABS: 0.4 10*3/uL (ref 0.1–0.9)
Neutrophils Absolute: 4.5 10*3/uL (ref 1.4–7.0)
Neutrophils: 64 %
Platelets: 230 10*3/uL (ref 150–379)
RBC: 4.32 x10E6/uL (ref 3.77–5.28)
RDW: 14.9 % (ref 12.3–15.4)
WBC: 7.1 10*3/uL (ref 3.4–10.8)

## 2014-10-05 LAB — LIPID PANEL
CHOL/HDL RATIO: 4.3 ratio (ref 0.0–4.4)
Cholesterol, Total: 205 mg/dL — ABNORMAL HIGH (ref 100–199)
HDL: 48 mg/dL (ref >39)
LDL Calculated: 131 mg/dL — ABNORMAL HIGH (ref 0–99)
Triglycerides: 129 mg/dL (ref 0–149)
VLDL Cholesterol Cal: 26 mg/dL (ref 5–40)

## 2014-10-05 LAB — TSH: TSH: 2.46 u[IU]/mL (ref 0.450–4.500)

## 2014-10-05 LAB — VITAMIN D 25 HYDROXY (VIT D DEFICIENCY, FRACTURES): Vit D, 25-Hydroxy: 30.7 ng/mL (ref 30.0–100.0)

## 2014-10-05 NOTE — Telephone Encounter (Signed)
Pt advised as directed below.  She wants to work on lifestyle changes before starting a statin.   Thanks,   -Mickel Baas

## 2014-10-05 NOTE — Telephone Encounter (Signed)
-----   Message from Margarita Rana, MD sent at 10/05/2014  1:56 PM EDT ----- Cholesterol is mildly elevated. In light of borderline blood sugars, a statin would be recommended.  Please see if patient would like to start a statin.  Labs otherwise ok.  Thanks.

## 2015-01-31 ENCOUNTER — Other Ambulatory Visit: Payer: Self-pay

## 2015-01-31 ENCOUNTER — Encounter: Payer: Self-pay | Admitting: Family Medicine

## 2015-01-31 ENCOUNTER — Ambulatory Visit (INDEPENDENT_AMBULATORY_CARE_PROVIDER_SITE_OTHER): Payer: PPO | Admitting: Family Medicine

## 2015-01-31 VITALS — BP 120/70 | HR 56 | Temp 98.3°F | Resp 16 | Ht 65.0 in | Wt 165.0 lb

## 2015-01-31 DIAGNOSIS — Z23 Encounter for immunization: Secondary | ICD-10-CM | POA: Diagnosis not present

## 2015-01-31 DIAGNOSIS — E119 Type 2 diabetes mellitus without complications: Secondary | ICD-10-CM | POA: Diagnosis not present

## 2015-01-31 DIAGNOSIS — Z0000 Encounter for general adult medical examination without abnormal findings: Secondary | ICD-10-CM

## 2015-01-31 LAB — POCT GLYCOSYLATED HEMOGLOBIN (HGB A1C): HEMOGLOBIN A1C: 6

## 2015-01-31 MED ORDER — GLUCOSE BLOOD VI STRP: ORAL_STRIP | Status: DC

## 2015-01-31 NOTE — Progress Notes (Signed)
Patient ID: Tracie Gray, female   DOB: [birth year], 74 y.o.   MRN: [id number]        Patient: Tracie Gray, Female    DOB: 09-18-1940, 74 y.o.   MRN: [id number] Visit Date: 01/31/2015  Today's Provider: Margarita Rana, MD   Chief Complaint  Patient presents with  . Medicare Wellness  . Diabetes   Subjective:    Annual wellness visit Tracie Gray is a 74 y.o. female. She feels well. She reports exercising none. She reports she is sleeping well.  Chronic problems stable.   10/06/13 CPE 11/30/13 Mammo-BI-RADS 1 12/03/11 Colon-polyps, diverticulosis 11/30/13 BMD-osteopenia  Lab Results  Component Value Date   WBC 7.1 10/04/2014   HGB 13.6 07/21/2013   HCT 40.4 10/04/2014   PLT 243.0 07/21/2013   GLUCOSE 134* 10/04/2014   CHOL 205* 10/04/2014   TRIG 129 10/04/2014   HDL 48 10/04/2014   LDLCALC 131* 10/04/2014   ALT 17 10/04/2014   AST 20 10/04/2014   NA 143 10/04/2014   K 4.6 10/04/2014   CL 100 10/04/2014   CREATININE 1.12* 10/04/2014   BUN 17 10/04/2014   CO2 30* 10/04/2014   TSH 2.460 10/04/2014   HGBA1C 6.0 01/31/2015   ------------------------------------------------------------------------------------------------------------------------------------  Diabetes Mellitus Type II, Follow-up:   Lab Results  Component Value Date   HGBA1C 6.0 01/31/2015   HGBA1C 5.8 09/28/2014   HGBA1C 6.0 03/30/2014    Last seen for diabetes 4 months ago.  Management since then includes no changes. She reports excellent compliance with treatment. She is not having side effects.  Current symptoms include none and have been stable. Home blood sugar records: fasting range: 130's  Episodes of hypoglycemia? no   Current Insulin Regimen: none Most Recent Eye Exam: ut to date Weight trend: increasing steadily Prior visit with dietician: no Current diet: in general, a "healthy" diet   Current exercise: none  Pertinent Labs: Wt Readings from Last 3  Encounters:  01/31/15 165 lb (74.844 kg)  09/28/14 164 lb (74.39 kg)  03/30/14 164 lb (74.39 kg)   ----------------------------------------------------------------------------------------------------------------------------  Review of Systems  Constitutional: Negative.   HENT: Positive for tinnitus.   Eyes: Negative.   Respiratory: Negative.   Cardiovascular: Negative.   Gastrointestinal: Negative.   Endocrine: Negative.   Genitourinary: Negative.   Musculoskeletal: Positive for arthralgias.  Skin: Negative.   Allergic/Immunologic: Negative.   Neurological: Positive for numbness.  Hematological: Negative.   Psychiatric/Behavioral: Negative.     Social History   Social History  . Marital Status: Married    Spouse Name: Fritz Pickerel  . Number of Children: 3  . Years of Education: N/A   Occupational History  . Not on file.   Social History Main Topics  . Smoking status: Former Smoker -- 1.00 packs/day for 1 years    Types: Cigarettes    Quit date: 03/23/1962  . Smokeless tobacco: Never Used  . Alcohol Use: No  . Drug Use: No  . Sexual Activity: Not on file   Other Topics Concern  . Not on file   Social History Narrative    Patient Active Problem List   Diagnosis Date Noted  . Elevated LDL cholesterol level 09/28/2014  . Benign hypertension with chronic kidney disease, stage III 07/27/2014  . Adult BMI 30+ 07/27/2014  . ABG (arterial blood gas) abnormal 07/27/2014  . 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 Chronic kidney disease (CKD), stage III (moderate) 07/27/2014  . Diabetes ([facility]) 07/27/2014  . Accumulation of fluid  in tissues 07/27/2014  . Vitamin D deficiency 07/27/2014  . Neuropathy ([facility]) 07/27/2014  . Disorder of peripheral nervous system ([place]) 07/27/2014  . Type 2 diabetes mellitus ([place]) 02/01/2014  . Shortness of breath 06/27/2013  . Personal history of colonic polyps-adenomas 12/03/2011  . Angiodysplasia of cecum 12/03/2011    Past Surgical History    Procedure Laterality Date  . Cataract extraction Bilateral 2003  . Cholecystectomy  1982  . Abdominal hysterectomy  1981  . Cesarean section  1981  . Retinopathy surgery Right     Her family history includes Cancer in her mother; Coronary artery disease in her father; Heart attack in her father and paternal grandfather; Heart disease in her father. There is no history of Colon cancer, Rectal cancer, Stomach cancer, or Esophageal cancer.    Previous Medications   ASPIRIN 81 MG TABLET    Take 81 mg by mouth daily.   ATENOLOL (TENORMIN) 25 MG TABLET    Take 25 mg by mouth daily.    CHOLECALCIFEROL (VITAMIN D-3) 5000 UNITS TABS    Take 1 tablet by mouth every 7 (seven) days.   CLOBETASOL CREAM (TEMOVATE) 0.05 %    Apply 1 application topically 2 (two) times daily. PRN   FUROSEMIDE (LASIX) 20 MG TABLET    Take by mouth.   HYDROCHLOROTHIAZIDE (HYDRODIURIL) 25 MG TABLET    Take 1 tablet (25 mg total) by mouth daily.   POTASSIUM CHLORIDE (MICRO-K) 10 MEQ CR CAPSULE    Take by mouth.    Patient Care Team: Margarita Rana, MD as PCP - General (Family Medicine)     Objective:   Vitals: BP 120/70 mmHg  Pulse 56  Temp(Src) 98.3 F (36.8 C)  Resp 16  Ht 5\' 5"  (1.651 m)  Wt 165 lb (74.844 kg)  BMI 27.46 kg/m2  SpO2 97%  Physical Exam  Constitutional: She is oriented to person, place, and time. She appears well-developed and well-nourished.  HENT:  Head: Normocephalic and atraumatic.  Right Ear: Tympanic membrane, external ear and ear canal normal.  Left Ear: Tympanic membrane, external ear and ear canal normal.  Nose: Nose normal.  Mouth/Throat: Uvula is midline, oropharynx is clear and moist and mucous membranes are normal.  Eyes: Conjunctivae, EOM and lids are normal. Pupils are equal, round, and reactive to light.  Neck: Trachea normal and normal range of motion. Neck supple. Carotid bruit is not present. No thyroid mass and no thyromegaly present.  Cardiovascular: Normal rate,  regular rhythm and normal heart sounds.   Pulmonary/Chest: Effort normal and breath sounds normal.  Abdominal: Soft. Normal appearance and bowel sounds are normal. There is no hepatosplenomegaly. There is no tenderness.  Musculoskeletal: Normal range of motion.  Lymphadenopathy:    She has no cervical adenopathy.    She has no axillary adenopathy.  Neurological: She is alert and oriented to person, place, and time. She has normal strength. No cranial nerve deficit.  Skin: Skin is warm, dry and intact.  Psychiatric: She has a normal mood and affect. Her speech is normal and behavior is normal. Judgment and thought content normal. Cognition and memory are normal.    Activities of Daily Living In your present state of health, do you have any difficulty performing the following activities: 01/31/2015  Hearing? N  Vision? Y  Difficulty concentrating or making decisions? Y  Walking or climbing stairs? Y  Dressing or bathing? N  Doing errands, shopping? N    Fall Risk Assessment Fall Risk  01/31/2015  Falls in the past year? No     Depression Screen PHQ 2/9 Scores 01/31/2015  PHQ - 2 Score 0    Cognitive Testing - 6-CIT  Correct? Score   What year is it? yes 0 0 or 4  What month is it? yes 0 0 or 3  Memorize:    Pia Mau,  41,  [address]  [place],      What time is it? (within 1 hour) yes 0 0 or 3  Count backwards from 20 yes 0 0, 2, or 4  Name the months of the year yes 0 0, 2, or 4  Repeat name & address above yes 2 0, 2, 4, 6, 8, or 10       TOTAL SCORE  2/28   Interpretation:  Normal  Normal (0-7) Abnormal (8-28)   Assessment & Plan:     Annual Wellness Visit  Reviewed patient's Family Medical History Reviewed and updated list of patient's medical providers Assessment of cognitive impairment was done Assessed patient's functional ability Established a written schedule for health screening [place] Completed and Reviewed  Exercise  Activities and Dietary recommendations Goals    . Exercise 150 minutes per week (moderate activity)       Immunization History  Administered Date(s) Administered  . Influenza,inj,Quad PF,36+ Mos 01/31/2015  . Pneumococcal Conjugate-13 01/31/2015  . Pneumococcal Polysaccharide-23 11/04/2011  . Tdap 11/04/2011        1. Medicare annual wellness visit, subsequent Stable. Patient advised to continue eating healthy and exercise daily.  2. Diabetes mellitus without complication (HCC) Stable. Continue current medicat - POCT glycosylated hemoglobin (Hb A1C) Results for orders placed or performed in visit on 01/31/15  POCT glycosylated hemoglobin (Hb A1C)  Result Value Ref Range   Hemoglobin A1C 6.0     3. Need for pneumococcal vaccination - Pneumococcal conjugate vaccine 13-valent IM  4. Need for influenza vaccination - Flu Vaccine QUAD 36+ mos IM     Patient seen and examined by Dr. Jerrell Belfast, and note scribed by Philbert Riser. Dimas, CMA.  I have reviewed the document for accuracy and completeness and I agree with above. Jerrell Belfast, MD    Margarita Rana, MD    ------------------------------------------------------------------------------------------------------------

## 2015-02-18 ENCOUNTER — Encounter: Payer: Self-pay | Admitting: Internal Medicine

## 2015-02-21 ENCOUNTER — Telehealth: Payer: Self-pay

## 2015-02-21 NOTE — Telephone Encounter (Signed)
Continue ice pack for 20 minutes several x day for the next 24 hours. May also add tylenol with Aleve for pain.

## 2015-02-21 NOTE — Telephone Encounter (Signed)
Pt reports she twisted her right knee early Wednesday morning, pt is now c/o pain when bending. Patient denies swelling or redness around knee. Pt denies previous injury or surgery to knee. Patient reports she is able to walk and bear weight. Patient is taking Aleve and using ice pack to help with pain. Patient reports minimal pain control. Patient is scheduled for tomorrow at 10 am. sd

## 2015-02-21 NOTE — Telephone Encounter (Signed)
Patient advised as below. Patient verbalizes understanding and is in agreement with treatment plan.  

## 2015-02-22 ENCOUNTER — Encounter: Payer: Self-pay | Admitting: Family Medicine

## 2015-02-22 ENCOUNTER — Ambulatory Visit (INDEPENDENT_AMBULATORY_CARE_PROVIDER_SITE_OTHER): Payer: PPO | Admitting: Family Medicine

## 2015-02-22 VITALS — BP 120/72 | HR 56 | Temp 98.6°F | Resp 16 | Wt 167.8 lb

## 2015-02-22 DIAGNOSIS — S86111A Strain of other muscle(s) and tendon(s) of posterior muscle group at lower leg level, right leg, initial encounter: Secondary | ICD-10-CM

## 2015-02-22 DIAGNOSIS — S86811A Strain of other muscle(s) and tendon(s) at lower leg level, right leg, initial encounter: Secondary | ICD-10-CM | POA: Diagnosis not present

## 2015-02-22 NOTE — Patient Instructions (Addendum)
Discussed use of warm compresses, Tylenol and short term use of Aleve due to your kidneys.

## 2015-02-22 NOTE — Progress Notes (Signed)
Subjective:     Patient ID: Tracie Gray, female   DOB: 04-27-1940, 74 y.o.   MRN: [id number]  HPI  Chief Complaint  Patient presents with  . Knee Pain    Patient comes in office today concerns with knee pain (right) for the past 2 days. Patient reports that the night before she was sleeping on couch and the next morning when she rolled over she heard a loud "pop" sound. Patient states that pain is behind knee and described as a sharp pain radiating up her right thigh. Patient denies taking any otc medication but is wearing a brace today in office.   Denies a fall or prior knee surgery. Has taken Tylenol and Aleve for her symptoms. States her knee has improved a bit and only hurts when she flexes it.   Review of Systems     Objective:   Physical Exam  Constitutional: She appears well-developed and well-nourished. No distress.  Cardiovascular:  Pulses:      Dorsalis pedis pulses are 2+ on the right side.       Posterior tibial pulses are 2+ on the right side.  Musculoskeletal: She exhibits no edema.  KF/KE 5/5. Limited flexion with tenderness posterior knee lateral aspect. No erythema or cyst noted.       Assessment:    1. Gastrocnemius strain, right, initial encounter    Plan:    Discussed warm compresses and use of Tylenol and short term use of Aleve.

## 2015-04-26 DIAGNOSIS — Z9842 Cataract extraction status, left eye: Secondary | ICD-10-CM | POA: Diagnosis not present

## 2015-04-26 DIAGNOSIS — H35371 Puckering of macula, right eye: Secondary | ICD-10-CM | POA: Diagnosis not present

## 2015-04-26 DIAGNOSIS — E119 Type 2 diabetes mellitus without complications: Secondary | ICD-10-CM | POA: Diagnosis not present

## 2015-04-26 DIAGNOSIS — Z9841 Cataract extraction status, right eye: Secondary | ICD-10-CM | POA: Diagnosis not present

## 2015-04-26 LAB — HM DIABETES EYE EXAM

## 2015-07-31 ENCOUNTER — Encounter: Payer: Self-pay | Admitting: Family Medicine

## 2015-07-31 ENCOUNTER — Ambulatory Visit (INDEPENDENT_AMBULATORY_CARE_PROVIDER_SITE_OTHER): Payer: PPO | Admitting: Family Medicine

## 2015-07-31 VITALS — BP 132/64 | HR 56 | Temp 98.8°F | Resp 16 | Wt 167.0 lb

## 2015-07-31 DIAGNOSIS — N183 Chronic kidney disease, stage 3 unspecified: Secondary | ICD-10-CM

## 2015-07-31 DIAGNOSIS — I10 Essential (primary) hypertension: Secondary | ICD-10-CM

## 2015-07-31 DIAGNOSIS — E119 Type 2 diabetes mellitus without complications: Secondary | ICD-10-CM | POA: Diagnosis not present

## 2015-07-31 DIAGNOSIS — E7800 Pure hypercholesterolemia, unspecified: Secondary | ICD-10-CM | POA: Diagnosis not present

## 2015-07-31 DIAGNOSIS — I129 Hypertensive chronic kidney disease with stage 1 through stage 4 chronic kidney disease, or unspecified chronic kidney disease: Secondary | ICD-10-CM

## 2015-07-31 LAB — POCT GLYCOSYLATED HEMOGLOBIN (HGB A1C): Hemoglobin A1C: 5.9

## 2015-07-31 LAB — POCT UA - MICROALBUMIN: MICROALBUMIN (UR) POC: NEGATIVE mg/L

## 2015-07-31 MED ORDER — HYDROCHLOROTHIAZIDE 25 MG PO TABS: 25.0000 mg | ORAL_TABLET | Freq: Every day | ORAL | Status: DC

## 2015-07-31 MED ORDER — ATENOLOL 25 MG PO TABS: 25.0000 mg | ORAL_TABLET | Freq: Every day | ORAL | Status: DC

## 2015-07-31 NOTE — Progress Notes (Signed)
Patient ID: Tracie Gray, female   DOB: 1940-04-19, 75 y.o.   MRN: [id number]         Patient: SHARIDA JANDT Female    DOB: [birth year]   75 y.o.   MRN: [id number] Visit Date: 07/31/2015  Today's Provider: Margarita Rana, MD   Chief Complaint  Patient presents with  . Hypertension  . Diabetes   Subjective:    HPI   Diabetes Mellitus Type II, Follow-up:   Lab Results  Component Value Date   HGBA1C 5.9 07/31/2015   HGBA1C 6.0 01/31/2015   HGBA1C 5.8 09/28/2014   Last seen for diabetes 6 months ago.  She reports excellent compliance with treatment. She is not having side effects.  Current symptoms include none and have been stable. Home blood sugar records: fasting range: 140-150's  Episodes of hypoglycemia? no   Most Recent Eye Exam: UTD Weight trend: stable Prior visit with dietician: no Current diet: in general, a "healthy" diet   Current exercise: housecleaning, no regular exercise and yard work  ------------------------------------------------------------------------   Hypertension, follow-up:  BP Readings from Last 3 Encounters:  07/31/15 132/64  02/22/15 120/72  01/31/15 120/70    She was last seen for hypertension 6 months ago.  Management since that visit includes None .She reports excellent compliance with treatment. She is not having side effects.  She not exercising regularly.  She is adherent to low salt diet.   She is experiencing none.  Patient denies chest pain, irregular heart beat, lower extremity edema and palpitations.   Cardiovascular risk factors include advanced age (older than 47 for men, 85 for women), diabetes mellitus and hypertension. ------------------------------------------------------------------------      Allergies  Allergen Reactions  . Calcium Channel Blockers Rash  . Norvasc [Amlodipine Besylate] Rash  . Penicillins Rash   Previous Medications   ASPIRIN 81 MG TABLET    Take 81 mg by mouth daily.   CHOLECALCIFEROL (VITAMIN D-3) 5000 UNITS TABS    Take 1 tablet by mouth every 7 (seven) days.   CLOBETASOL CREAM (TEMOVATE) 0.05 %    Apply 1 application topically 2 (two) times daily. PRN   FUROSEMIDE (LASIX) 20 MG TABLET    Take by mouth.   GLUCOSE BLOOD (ONE TOUCH ULTRA TEST) TEST STRIP    Check blood sugar twice a day.  DX: E11.9   POTASSIUM CHLORIDE (MICRO-K) 10 MEQ CR CAPSULE    Take by mouth.    Review of Systems  Constitutional: Negative.   Respiratory: Negative.   Cardiovascular: Negative.   Gastrointestinal: Negative.   Musculoskeletal: Positive for arthralgias (Knees hurt occasionally). Negative for myalgias, back pain, joint swelling, gait problem, neck pain and neck stiffness.  Neurological: Positive for numbness (Feet are numb. ). Negative for dizziness, facial asymmetry, weakness and headaches.    Social History  Substance Use Topics  . Smoking status: Former Smoker -- 1.00 packs/day for 1 years    Types: Cigarettes    Quit date: 03/23/1962  . Smokeless tobacco: Never Used  . Alcohol Use: No   Objective:   BP 132/64 mmHg  Pulse 56  Temp(Src) 98.8 F (37.1 C) (Oral)  Resp 16  Wt 167 lb (75.751 kg)  Physical Exam  Constitutional: She is oriented to person, place, and time. She appears well-developed and well-nourished.  Neurological: She is alert and oriented to person, place, and time.  Skin: Skin is warm and dry.  Psychiatric: She has a normal mood and affect. Her behavior is normal. Judgment and  thought content normal.      Assessment & Plan:     1. Benign hypertension with chronic kidney disease, stage III Condition is stable. Please continue current medication and  plan of care as noted.   - atenolol (TENORMIN) 25 MG tablet; Take 1 tablet (25 mg total) by mouth daily.  Dispense: 90 tablet; Refill: 3  2. Type 2 diabetes mellitus without complication, without long-term current use of insulin (HCC) Excellent blood sugar control. Continue current diet and  follow up in 3 to 6 months.   - POCT glycosylated hemoglobin (Hb A1C) - POCT UA - Microalbumin Results for orders placed or performed in visit on 07/31/15  POCT glycosylated hemoglobin (Hb A1C)  Result Value Ref Range   Hemoglobin A1C 5.9   POCT UA - Microalbumin  Result Value Ref Range   Microalbumin Ur, POC Negative mg/L    Patient was seen and examined by Jerrell Belfast, MD, and note scribed by Ashley Royalty, CMA.   I have reviewed the document for accuracy and completeness and I agree with above. - Jerrell Belfast, MD      Margarita Rana, MD  [facility]

## 2015-08-05 DIAGNOSIS — E119 Type 2 diabetes mellitus without complications: Secondary | ICD-10-CM | POA: Diagnosis not present

## 2015-08-05 DIAGNOSIS — I10 Essential (primary) hypertension: Secondary | ICD-10-CM | POA: Diagnosis not present

## 2015-08-05 DIAGNOSIS — Z794 Long term (current) use of insulin: Secondary | ICD-10-CM | POA: Diagnosis not present

## 2015-08-07 DIAGNOSIS — E7800 Pure hypercholesterolemia, unspecified: Secondary | ICD-10-CM | POA: Diagnosis not present

## 2015-08-07 DIAGNOSIS — I10 Essential (primary) hypertension: Secondary | ICD-10-CM | POA: Diagnosis not present

## 2015-08-07 DIAGNOSIS — E119 Type 2 diabetes mellitus without complications: Secondary | ICD-10-CM | POA: Diagnosis not present

## 2015-08-07 DIAGNOSIS — I129 Hypertensive chronic kidney disease with stage 1 through stage 4 chronic kidney disease, or unspecified chronic kidney disease: Secondary | ICD-10-CM | POA: Diagnosis not present

## 2015-08-07 DIAGNOSIS — N183 Chronic kidney disease, stage 3 (moderate): Secondary | ICD-10-CM | POA: Diagnosis not present

## 2015-08-08 ENCOUNTER — Telehealth: Payer: Self-pay

## 2015-08-08 LAB — CBC WITH DIFFERENTIAL/PLATELET
BASOS ABS: 0.1 10*3/uL (ref 0.0–0.2)
BASOS: 1 %
EOS (ABSOLUTE): 0.3 10*3/uL (ref 0.0–0.4)
Eos: 4 %
HEMOGLOBIN: 13.4 g/dL (ref 11.1–15.9)
Hematocrit: 40 % (ref 34.0–46.6)
IMMATURE GRANS (ABS): 0 10*3/uL (ref 0.0–0.1)
IMMATURE GRANULOCYTES: 0 %
LYMPHS: 42 %
Lymphocytes Absolute: 2.6 10*3/uL (ref 0.7–3.1)
MCH: 32.2 pg (ref 26.6–33.0)
MCHC: 33.5 g/dL (ref 31.5–35.7)
MCV: 96 fL (ref 79–97)
MONOCYTES: 7 %
Monocytes Absolute: 0.5 10*3/uL (ref 0.1–0.9)
NEUTROS PCT: 46 %
Neutrophils Absolute: 2.8 10*3/uL (ref 1.4–7.0)
PLATELETS: 220 10*3/uL (ref 150–379)
RBC: 4.16 x10E6/uL (ref 3.77–5.28)
RDW: 14.4 % (ref 12.3–15.4)
WBC: 6.1 10*3/uL (ref 3.4–10.8)

## 2015-08-08 LAB — COMPREHENSIVE METABOLIC PANEL
ALBUMIN: 3.7 g/dL (ref 3.5–4.8)
ALT: 13 IU/L (ref 0–32)
AST: 14 IU/L (ref 0–40)
Albumin/Globulin Ratio: 1.3 (ref 1.2–2.2)
Alkaline Phosphatase: 52 IU/L (ref 39–117)
BUN / CREAT RATIO: 14 (ref 12–28)
BUN: 15 mg/dL (ref 8–27)
Bilirubin Total: 0.5 mg/dL (ref 0.0–1.2)
CALCIUM: 9.4 mg/dL (ref 8.7–10.3)
CO2: 28 mmol/L (ref 18–29)
CREATININE: 1.08 mg/dL — AB (ref 0.57–1.00)
Chloride: 99 mmol/L (ref 96–106)
GFR, EST AFRICAN AMERICAN: 58 mL/min/{1.73_m2} — AB (ref >59)
GFR, EST NON AFRICAN AMERICAN: 50 mL/min/{1.73_m2} — AB (ref >59)
GLUCOSE: 143 mg/dL — AB (ref 65–99)
Globulin, Total: 2.8 g/dL (ref 1.5–4.5)
Potassium: 3.7 mmol/L (ref 3.5–5.2)
Sodium: 142 mmol/L (ref 134–144)
TOTAL PROTEIN: 6.5 g/dL (ref 6.0–8.5)

## 2015-08-08 LAB — LIPID PANEL
CHOL/HDL RATIO: 4.2 ratio (ref 0.0–4.4)
Cholesterol, Total: 206 mg/dL — ABNORMAL HIGH (ref 100–199)
HDL: 49 mg/dL (ref >39)
LDL CALC: 130 mg/dL — AB (ref 0–99)
Triglycerides: 133 mg/dL (ref 0–149)
VLDL CHOLESTEROL CAL: 27 mg/dL (ref 5–40)

## 2015-08-08 LAB — TSH: TSH: 3.34 u[IU]/mL (ref 0.450–4.500)

## 2015-08-08 NOTE — Telephone Encounter (Signed)
Informed pt as below. Breniyah Romm Drozdowski, CMA  

## 2015-08-08 NOTE — Telephone Encounter (Signed)
Pt states she does not want to take cholesterol medication, and is asking if there is something "more natural" she can take? Renaldo Fiddler, CMA

## 2015-08-08 NOTE — Telephone Encounter (Signed)
Can try to eat healthy and exercise and recheck annually. Thanks.

## 2015-08-08 NOTE — Telephone Encounter (Signed)
-----   Message from Margarita Rana, MD sent at 08/08/2015  6:29 AM EDT ----- Labs stable except mildly elevated cholesterol.  In light of pre-diabetes and high blood pressure,  Cholesterol medication is recommended.  Please see if patient would like to start cholesterol medication. Thanks.

## 2016-01-22 DIAGNOSIS — Z794 Long term (current) use of insulin: Secondary | ICD-10-CM | POA: Diagnosis not present

## 2016-01-22 DIAGNOSIS — I10 Essential (primary) hypertension: Secondary | ICD-10-CM | POA: Diagnosis not present

## 2016-01-22 DIAGNOSIS — E119 Type 2 diabetes mellitus without complications: Secondary | ICD-10-CM | POA: Diagnosis not present

## 2016-02-05 ENCOUNTER — Encounter: Payer: Self-pay | Admitting: Family Medicine

## 2016-02-05 ENCOUNTER — Ambulatory Visit (INDEPENDENT_AMBULATORY_CARE_PROVIDER_SITE_OTHER): Payer: PPO | Admitting: Family Medicine

## 2016-02-05 VITALS — BP 126/62 | HR 54 | Temp 99.1°F | Resp 16 | Ht 65.0 in | Wt 167.0 lb

## 2016-02-05 DIAGNOSIS — Z0000 Encounter for general adult medical examination without abnormal findings: Secondary | ICD-10-CM | POA: Diagnosis not present

## 2016-02-05 DIAGNOSIS — E559 Vitamin D deficiency, unspecified: Secondary | ICD-10-CM | POA: Diagnosis not present

## 2016-02-05 DIAGNOSIS — N183 Chronic kidney disease, stage 3 unspecified: Secondary | ICD-10-CM

## 2016-02-05 DIAGNOSIS — I129 Hypertensive chronic kidney disease with stage 1 through stage 4 chronic kidney disease, or unspecified chronic kidney disease: Secondary | ICD-10-CM | POA: Diagnosis not present

## 2016-02-05 DIAGNOSIS — Z23 Encounter for immunization: Secondary | ICD-10-CM

## 2016-02-05 DIAGNOSIS — E119 Type 2 diabetes mellitus without complications: Secondary | ICD-10-CM | POA: Diagnosis not present

## 2016-02-05 LAB — POCT GLYCOSYLATED HEMOGLOBIN (HGB A1C)
Est. average glucose Bld gHb Est-mCnc: 128
HEMOGLOBIN A1C: 6.1

## 2016-02-05 NOTE — Patient Instructions (Signed)
   Be sure to follow up with gastroenterologist for your colonoscopy   Please call the [facility] [phone]) to schedule a routine screening mammogram.

## 2016-02-05 NOTE — Progress Notes (Signed)
Patient: Tracie Gray, Female    DOB: 06-21-1940, 75 y.o.   MRN: [id number] Visit Date: 02/05/2016  Today's Provider: Lelon Huh, MD   Chief Complaint  Patient presents with  . Annual wellness exam  . Hypertension  . Diabetes   Subjective:    Annual wellness exam Tracie Gray is a 75 y.o. female. She feels well. She reports exercising occasionally. She reports she is sleeping well.    Hypertension, follow-up:  BP Readings from Last 3 Encounters:  02/05/16 126/62  07/31/15 132/64  02/22/15 120/72    She was last seen for hypertension 6 months ago.  BP at that visit was 132/64. Management since that visit includes no changes. She reports good compliance with treatment. She is not having side effects.  She is not exercising. She is adherent to low salt diet.   Outside blood pressures are checked occasionally. Patient denies chest pain, exertional chest pressure/discomfort, irregular heart beat, lower extremity edema and tachypnea.   Cardiovascular risk factors include diabetes mellitus.     Weight trend: stable Wt Readings from Last 3 Encounters:  02/05/16 167 lb (75.8 kg)  07/31/15 167 lb (75.8 kg)  02/22/15 167 lb 12.8 oz (76.1 kg)    Current diet: well balanced    Diabetes Mellitus Type II, Follow-up:   Lab Results  Component Value Date   HGBA1C 6.1 02/05/2016   HGBA1C 5.9 07/31/2015   HGBA1C 6.0 01/31/2015    Last seen for diabetes 6 months ago.  Management since then includes no changes. She reports good compliance with treatment. She is not having side effects.  Current symptoms include none and have been stable. Home blood sugar records: not being checked.   Episodes of hypoglycemia? no   Current Insulin Regimen: none Most Recent Eye Exam: due Weight trend: stable Prior visit with dietician: no Current diet: well balanced Current exercise: walking, occasionally.   Pertinent Labs:    Component Value Date/Time     CHOL 206 (H) 08/07/2015 0815   TRIG 133 08/07/2015 0815   HDL 49 08/07/2015 0815   LDLCALC 130 (H) 08/07/2015 0815   CREATININE 1.08 (H) 08/07/2015 0815    Wt Readings from Last 3 Encounters:  02/05/16 167 lb (75.8 kg)  07/31/15 167 lb (75.8 kg)  02/22/15 167 lb 12.8 oz (76.1 kg)       Review of Systems  Constitutional: Negative.   HENT: Negative.  Negative for congestion.   Eyes: Negative.   Respiratory: Negative.   Cardiovascular: Negative.   Gastrointestinal: Negative.   Endocrine: Negative.   Genitourinary: Negative.   Musculoskeletal: Positive for arthralgias and joint swelling.  Skin: Negative.   Allergic/Immunologic: Negative.   Neurological: Negative.   Hematological: Negative.   Psychiatric/Behavioral: Negative.     Social History   Social History  . Marital status: Married    Spouse name: Fritz Pickerel  . Number of children: 3  . Years of education: N/A   Occupational History  . Not on file.   Social History Main Topics  . Smoking status: Former Smoker    Packs/day: 1.00    Years: 1.00    Types: Cigarettes    Quit date: 03/23/1962  . Smokeless tobacco: Never Used  . Alcohol use No  . Drug use: No  . Sexual activity: Not on file   Other Topics Concern  . Not on file   Social History Narrative  . No narrative on file    Past Medical  History:  Diagnosis Date  . Arthritic-like pain   . Bilateral cataracts   . 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 Hypertension   . Personal history of colonic polyps-adenomas 12/03/2011  . Thyroid disease      Patient Active Problem List   Diagnosis Date Noted  . Elevated LDL cholesterol level 09/28/2014  . Benign hypertension with chronic kidney disease, stage III 07/27/2014  . Adult BMI 30+ 07/27/2014  . ABG (arterial blood gas) abnormal 07/27/2014  . 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 Chronic kidney disease (CKD), stage III (moderate) 07/27/2014  . Diabetes ([place]) 07/27/2014   . Accumulation of fluid in tissues 07/27/2014  . Vitamin D deficiency 07/27/2014  . Neuropathy ([place]) 07/27/2014  . 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 Type 2 diabetes mellitus ([place]) 02/01/2014  . Shortness of breath 06/27/2013  . Personal history of colonic polyps-adenomas 12/03/2011  . Angiodysplasia of cecum 12/03/2011    Past Surgical History:  Procedure Laterality Date  . ABDOMINAL HYSTERECTOMY  1981  . CATARACT EXTRACTION Bilateral 2003  . CESAREAN SECTION  1981  . CHOLECYSTECTOMY  1982  . RETINOPATHY SURGERY Right     Her family history includes Cancer in her mother; Coronary artery disease in her father; Heart attack in her father and paternal grandfather; Heart disease in her father.     Current Meds  Medication Sig  . aspirin 81 MG tablet Take 81 mg by mouth daily.  Marland Kitchen atenolol (TENORMIN) 25 MG tablet Take 1 tablet by mouth daily.  . Cholecalciferol (VITAMIN D-3) 5000 UNITS TABS Take 1 tablet by mouth every 7 (seven) days.  . Cinnamon 500 MG TABS Take 1 tablet by mouth 2 (two) times daily.  . clobetasol cream (TEMOVATE) [id number] % Apply 1 application topically 2 (two) times daily. PRN  . glucose blood (ONE TOUCH ULTRA TEST) test strip Check blood sugar twice a day.  DX: E11.9  . hydrochlorothiazide (HYDRODIURIL) 25 MG tablet Take 1 tablet (25 mg total) by mouth daily.    Patient Care Team: Birdie Sons, MD as PCP - General (Family Medicine)     Objective:   Vitals: BP 126/62 (BP Location: Left Arm, Patient Position: Sitting, Cuff Size: Normal)   Pulse (!) 54   Temp 99.1 F (37.3 C)   Resp 16   Ht 5\' 5"  (1.651 m)   Wt 167 lb (75.8 kg)   BMI 27.79 kg/m   Physical Exam  Constitutional: She is oriented to person, place, and time. She appears well-developed and well-nourished.  HENT:  Head: Normocephalic and atraumatic.  Right Ear: External ear normal.  Left Ear: External ear normal.  Nose: Nose normal.  Mouth/Throat: Oropharynx is  clear and moist.  Eyes: Conjunctivae and EOM are normal. Pupils are equal, round, and reactive to light.  Neck: Normal range of motion. Neck supple.  Cardiovascular: Normal rate, regular rhythm, normal heart sounds and intact distal pulses.   Pulmonary/Chest: Effort normal and breath sounds normal. Right breast exhibits no inverted nipple, no mass, no nipple discharge, no skin change and no tenderness. Left breast exhibits no inverted nipple, no mass, no nipple discharge, no skin change and no tenderness. Breasts are symmetrical.  Abdominal: Soft. Bowel sounds are normal.  Musculoskeletal: Normal range of motion.  Neurological: She is alert and oriented to person, place, and time.  Skin: Skin is warm and dry.  Psychiatric: She has a normal mood and affect. Her behavior  is normal. Judgment and thought content normal.    Activities of Daily Living In your present state of health, do you have any difficulty performing the following activities: 02/05/2016  Hearing? N  Vision? Y  Difficulty concentrating or making decisions? N  Walking or climbing stairs? Y  Dressing or bathing? N  Doing errands, shopping? N  Some recent data might be hidden    Fall Risk Assessment Fall Risk  02/05/2016 01/31/2015  Falls in the past year? No No     Depression Screen PHQ 2/9 Scores 02/05/2016 01/31/2015  PHQ - 2 Score 0 0    Cognitive Testing - 6-CIT  Correct? Score   What year is it? yes 0 0 or 4  What month is it? yes 0 0 or 3  Memorize:    Pia Mau,  41,  [address]  [place],      What time is it? (within 1 hour) yes 0 0 or 3  Count backwards from 20 yes 0 0, 2, or 4  Name the months of the year yes 0 0, 2, or 4  Repeat name & address above yes 0 0, 2, 4, 6, 8, or 10       TOTAL SCORE  0/28   Interpretation:  Normal  Normal (0-7) Abnormal (8-28)    Results for orders placed or performed in visit on 02/05/16  POCT glycosylated hemoglobin (Hb A1C)  Result Value Ref Range   Hemoglobin  A1C 6.1    Est. average glucose Bld gHb Est-mCnc 128       Assessment & Plan:     Annual Physical Reviewed patient's Family Medical History Reviewed and updated list of patient's medical providers Assessment of cognitive impairment was done Assessed patient's functional ability Established a written schedule for health screening [place] Completed and Reviewed  Exercise Activities and Dietary recommendations Goals    . Exercise 150 minutes per week (moderate activity)       Immunization History  Administered Date(s) Administered  . Influenza, High Dose Seasonal PF 02/05/2016  . Influenza,inj,Quad PF,36+ Mos 01/31/2015  . Pneumococcal Conjugate-13 01/31/2015  . Pneumococcal Polysaccharide-23 11/04/2011  . Tdap 11/04/2011    Health Maintenance  Topic Date Due  . ZOSTAVAX  01/28/2000  . COLONOSCOPY  12/03/2014  . FOOT EXAM  09/28/2015  . INFLUENZA VACCINE  10/22/2015  . HEMOGLOBIN A1C  01/31/2016  . OPHTHALMOLOGY EXAM  04/25/2016  . URINE MICROALBUMIN  07/30/2016  . TETANUS/TDAP  11/03/2021  . DEXA SCAN  Completed  . PNA vac Low Risk Adult  Completed     Discussed health benefits of physical activity, and encouraged her to engage in regular exercise appropriate for her age and condition.    ------------------------------------------------------------------------------------------------------------  1. Annual physical exam   2. Benign hypertension with chronic kidney disease, stage III Well controlled.  Continue current medications.    3. Vitamin D deficiency Lab Results  Component Value Date   VD25OH 30.7 10/04/2014    4. Type 2 diabetes mellitus without complication, without long-term current use of insulin (HCC) Diet controlled. Follow up for a1c in 6 months.  - POCT glycosylated hemoglobin (Hb A1C)  5. Need for influenza vaccination  - Flu vaccine HIGH DOSE PF (Fluzone High dose)   The entirety of the information documented  in the History of Present Illness, Review of Systems and Physical Exam were personally obtained by me. Portions of this information were initially documented by Wilburt Finlay, [place] and reviewed  by me for thoroughness and accuracy.    Lelon Huh, MD  [facility]

## 2016-02-17 ENCOUNTER — Other Ambulatory Visit: Payer: Self-pay | Admitting: Family Medicine

## 2016-02-17 DIAGNOSIS — E119 Type 2 diabetes mellitus without complications: Secondary | ICD-10-CM

## 2016-07-23 DIAGNOSIS — I10 Essential (primary) hypertension: Secondary | ICD-10-CM | POA: Diagnosis not present

## 2016-07-23 DIAGNOSIS — Z794 Long term (current) use of insulin: Secondary | ICD-10-CM | POA: Diagnosis not present

## 2016-07-23 DIAGNOSIS — E119 Type 2 diabetes mellitus without complications: Secondary | ICD-10-CM | POA: Diagnosis not present

## 2016-08-04 ENCOUNTER — Encounter: Payer: Self-pay | Admitting: Family Medicine

## 2016-08-04 ENCOUNTER — Ambulatory Visit (INDEPENDENT_AMBULATORY_CARE_PROVIDER_SITE_OTHER): Payer: PPO | Admitting: Family Medicine

## 2016-08-04 VITALS — BP 138/62 | HR 50 | Temp 98.0°F | Resp 16 | Ht 65.0 in | Wt 169.0 lb

## 2016-08-04 DIAGNOSIS — E119 Type 2 diabetes mellitus without complications: Secondary | ICD-10-CM | POA: Diagnosis not present

## 2016-08-04 DIAGNOSIS — I129 Hypertensive chronic kidney disease with stage 1 through stage 4 chronic kidney disease, or unspecified chronic kidney disease: Secondary | ICD-10-CM | POA: Diagnosis not present

## 2016-08-04 DIAGNOSIS — E559 Vitamin D deficiency, unspecified: Secondary | ICD-10-CM

## 2016-08-04 DIAGNOSIS — Z86010 Personal history of colonic polyps: Secondary | ICD-10-CM

## 2016-08-04 DIAGNOSIS — N183 Chronic kidney disease, stage 3 unspecified: Secondary | ICD-10-CM

## 2016-08-04 DIAGNOSIS — G64 Other disorders of peripheral nervous system: Secondary | ICD-10-CM | POA: Diagnosis not present

## 2016-08-04 LAB — POCT GLYCOSYLATED HEMOGLOBIN (HGB A1C)
ESTIMATED AVERAGE GLUCOSE: 134
HEMOGLOBIN A1C: 6.3

## 2016-08-04 NOTE — Progress Notes (Signed)
Patient: Tracie Gray Female    DOB: [birth year]   76 y.o.   MRN: [id number] Visit Date: 08/04/2016  Today's Provider: Lelon Huh, MD   No chief complaint on file.  Subjective:    HPI   Diabetes Mellitus Type II, Follow-up:   Lab Results  Component Value Date   HGBA1C 6.1 02/05/2016   HGBA1C 5.9 07/31/2015   HGBA1C 6.0 01/31/2015   Last seen for diabetes 6 months ago.  Management since then includes; no changes. She reports good compliance with treatment.   ----------------------------------------------------------------   Hypertension, follow-up:  BP Readings from Last 3 Encounters:  02/05/16 126/62  07/31/15 132/64  02/22/15 120/72    She was last seen for hypertension 6 months ago.  BP at that visit was 126/62. Management since that visit includes; no changes.She reports good compliance with treatment. She is not having side effects.  She is not exercising. She is adherent to low salt diet.   Outside blood pressures are normal.  Patient denies chest pain, claudication, dyspnea, exertional chest pressure/discomfort, fatigue, palpitations, syncope and tachypnea.    ----------------------------------------------------------------  Vitamin D deficiency From 02/05/2016-no changes. Lab Results  Component Value Date   VD25OH 30.7 10/04/2014   Having some trouble with nasal congestion and drainage Improved with loratadine.     Allergies  Allergen Reactions  . Calcium Channel Blockers Rash  . Norvasc [Amlodipine Besylate] Rash  . Penicillins Rash     Current Outpatient Prescriptions:  .  aspirin 81 MG tablet, Take 81 mg by mouth daily., Disp: , Rfl:  .  atenolol (TENORMIN) 25 MG tablet, Take 1 tablet by mouth daily., Disp: , Rfl:  .  Cholecalciferol (VITAMIN D-3) 5000 UNITS TABS, Take 1 tablet by mouth every 7 (seven) days., Disp: , Rfl:  .  Cinnamon 500 MG TABS, Take 1 tablet by mouth 2 (two) times daily., Disp: , Rfl:  .  clobetasol  cream (TEMOVATE) 0.97 %, Apply 1 application topically 2 (two) times daily. PRN, Disp: , Rfl:  .  hydrochlorothiazide (HYDRODIURIL) 25 MG tablet, Take 1 tablet (25 mg total) by mouth daily., Disp: 90 tablet, Rfl: 3 .  ONE TOUCH ULTRA TEST test strip, CHECK BLOOD SUGAR TWICE A DAY, Disp: 200 each, Rfl: 3  Review of Systems  Constitutional: Negative for appetite change, chills, fatigue and fever.  Respiratory: Negative for chest tightness and shortness of breath.   Cardiovascular: Negative for chest pain and palpitations.  Gastrointestinal: Negative for abdominal pain, nausea and vomiting.  Neurological: Negative for dizziness and weakness.    Social History  Substance Use Topics  . Smoking status: Former Smoker    Packs/day: 1.00    Years: 1.00    Types: Cigarettes    Quit date: 03/23/1962  . Smokeless tobacco: Never Used  . Alcohol use No   Objective:     Vitals:   08/04/16 0956 08/04/16 1006  BP: (!) 150/60 138/62  Pulse: (!) 50   Resp: 16   Temp: 98 F (36.7 C)       Physical Exam   General Appearance:    Alert, cooperative, no distress  Eyes:    PERRL, conjunctiva/corneas clear, EOM's intact       Lungs:     Clear to auscultation bilaterally, respirations unlabored  Heart:    Regular rate and rhythm  Neurologic:   Awake, alert, oriented x 3. No apparent focal neurological  defect.       Results for orders placed or performed in visit on 08/04/16  POCT glycosylated hemoglobin (Hb A1C)  Result Value Ref Range   Hemoglobin A1C 6.3    Est. average glucose Bld gHb Est-mCnc 134        Assessment & Plan:     1. Type 2 diabetes mellitus without complication, without long-term current use of insulin (HCC) Diet controlled.  - POCT glycosylated hemoglobin (Hb A1C) - Lipid panel - Comprehensive metabolic panel - TSH  2. Personal history of colonic polyps-adenomas She has a letter from Dr. Carlean Purl to schedule follow up colonoscopy, and anticipates  scheduling after her husbands upcoming surgery.   3. Chronic kidney disease (CKD), stage III (moderate)  - Comprehensive metabolic panel  4. Benign hypertension with chronic kidney disease, stage III  - TSH  5. Vitamin D deficiency  - VITAMIN D 25 Hydroxy (Vit-D Deficiency, Fractures)  6. Disorder of peripheral nervous system  - Vitamin B12       Lelon Huh, MD  [facility]

## 2016-08-04 NOTE — Patient Instructions (Signed)
   Please call your gastroenterologist to schedule a follow up colonoscopy

## 2016-08-05 DIAGNOSIS — E119 Type 2 diabetes mellitus without complications: Secondary | ICD-10-CM | POA: Diagnosis not present

## 2016-08-05 DIAGNOSIS — G64 Other disorders of peripheral nervous system: Secondary | ICD-10-CM | POA: Diagnosis not present

## 2016-08-05 DIAGNOSIS — E559 Vitamin D deficiency, unspecified: Secondary | ICD-10-CM | POA: Diagnosis not present

## 2016-08-05 DIAGNOSIS — N183 Chronic kidney disease, stage 3 (moderate): Secondary | ICD-10-CM | POA: Diagnosis not present

## 2016-08-05 DIAGNOSIS — I129 Hypertensive chronic kidney disease with stage 1 through stage 4 chronic kidney disease, or unspecified chronic kidney disease: Secondary | ICD-10-CM | POA: Diagnosis not present

## 2016-08-06 LAB — COMPREHENSIVE METABOLIC PANEL
ALT: 17 IU/L (ref 0–32)
AST: 15 IU/L (ref 0–40)
Albumin/Globulin Ratio: 1.2 (ref 1.2–2.2)
Albumin: 3.8 g/dL (ref 3.5–4.8)
Alkaline Phosphatase: 42 IU/L (ref 39–117)
BILIRUBIN TOTAL: 0.6 mg/dL (ref 0.0–1.2)
BUN/Creatinine Ratio: 17 (ref 12–28)
BUN: 18 mg/dL (ref 8–27)
CHLORIDE: 101 mmol/L (ref 96–106)
CO2: 30 mmol/L — ABNORMAL HIGH (ref 18–29)
Calcium: 9.6 mg/dL (ref 8.7–10.3)
Creatinine, Ser: 1.03 mg/dL — ABNORMAL HIGH (ref 0.57–1.00)
GFR calc non Af Amer: 53 mL/min/{1.73_m2} — ABNORMAL LOW (ref >59)
GFR, EST AFRICAN AMERICAN: 61 mL/min/{1.73_m2} (ref >59)
Globulin, Total: 3.1 g/dL (ref 1.5–4.5)
Glucose: 138 mg/dL — ABNORMAL HIGH (ref 65–99)
Potassium: 4 mmol/L (ref 3.5–5.2)
Sodium: 143 mmol/L (ref 134–144)
TOTAL PROTEIN: 6.9 g/dL (ref 6.0–8.5)

## 2016-08-06 LAB — VITAMIN B12: VITAMIN B 12: 904 pg/mL (ref 232–1245)

## 2016-08-06 LAB — LIPID PANEL
Chol/HDL Ratio: 4.1 ratio (ref 0.0–4.4)
Cholesterol, Total: 190 mg/dL (ref 100–199)
HDL: 46 mg/dL (ref >39)
LDL Calculated: 120 mg/dL — ABNORMAL HIGH (ref 0–99)
Triglycerides: 119 mg/dL (ref 0–149)
VLDL Cholesterol Cal: 24 mg/dL (ref 5–40)

## 2016-08-06 LAB — TSH: TSH: 1.66 u[IU]/mL (ref 0.450–4.500)

## 2016-08-06 LAB — VITAMIN D 25 HYDROXY (VIT D DEFICIENCY, FRACTURES): VIT D 25 HYDROXY: 35.5 ng/mL (ref 30.0–100.0)

## 2016-10-14 ENCOUNTER — Other Ambulatory Visit: Payer: Self-pay | Admitting: Family Medicine

## 2016-10-14 DIAGNOSIS — I10 Essential (primary) hypertension: Secondary | ICD-10-CM

## 2016-10-14 MED ORDER — HYDROCHLOROTHIAZIDE 25 MG PO TABS: 25.0000 mg | ORAL_TABLET | Freq: Every day | ORAL | 4 refills | Status: DC

## 2016-10-14 NOTE — Telephone Encounter (Signed)
[facility] faxed a request on the following medication. Thanks [facility]  hydrochlorothiazide (HYDRODIURIL) 25 MG tablet  >Take one tablet by mouth every day.

## 2016-10-21 ENCOUNTER — Telehealth: Payer: Self-pay | Admitting: Family Medicine

## 2016-10-21 NOTE — Telephone Encounter (Signed)
Left message about need to schedule AWV w [facility]

## 2017-01-29 ENCOUNTER — Telehealth: Payer: Self-pay | Admitting: Family Medicine

## 2017-02-09 ENCOUNTER — Ambulatory Visit: Payer: PPO | Admitting: Family Medicine

## 2017-02-09 ENCOUNTER — Encounter: Payer: Self-pay | Admitting: Family Medicine

## 2017-02-09 VITALS — BP 138/84 | HR 53 | Temp 97.7°F | Resp 16 | Ht 65.0 in | Wt 169.0 lb

## 2017-02-09 DIAGNOSIS — Z23 Encounter for immunization: Secondary | ICD-10-CM | POA: Diagnosis not present

## 2017-02-09 DIAGNOSIS — G629 Polyneuropathy, unspecified: Secondary | ICD-10-CM | POA: Diagnosis not present

## 2017-02-09 DIAGNOSIS — E119 Type 2 diabetes mellitus without complications: Secondary | ICD-10-CM | POA: Diagnosis not present

## 2017-02-09 LAB — POCT GLYCOSYLATED HEMOGLOBIN (HGB A1C)
ESTIMATED AVERAGE GLUCOSE: 131
HEMOGLOBIN A1C: 6.2

## 2017-02-09 LAB — POCT UA - MICROALBUMIN: Microalbumin Ur, POC: NEGATIVE mg/L

## 2017-02-09 MED ORDER — ATENOLOL 25 MG PO TABS: 25.0000 mg | ORAL_TABLET | Freq: Every day | ORAL | 4 refills | Status: DC

## 2017-02-09 NOTE — Progress Notes (Signed)
poct      Patient: Tracie Gray Female    DOB: 1941-02-29   76 y.o.   MRN: [id number] Visit Date: 02/09/2017  Today's Provider: Lelon Huh, MD   Chief Complaint  Patient presents with  . Follow-up  . Diabetes  . Hypertension   Subjective:    HPI   Diabetes Mellitus Type II, Follow-up:   Lab Results  Component Value Date   HGBA1C 6.3 08/04/2016   HGBA1C 6.1 02/05/2016   HGBA1C 5.9 07/31/2015   Last seen for diabetes 6 months ago.  Management since then includes; no changes. She reports good compliance with treatment. She is not having side effects. none Current symptoms include none and have been unchanged. Home blood sugar records: fasting range: 150  Episodes of hypoglycemia? no   Current Insulin Regimen: n/a Most Recent Eye Exam: within the last year Weight trend: stable Prior visit with dietician: no Current diet: well balanced Current exercise: none  ----------------------------------------------------------------    Hypertension, follow-up:  BP Readings from Last 3 Encounters:  02/09/17 138/84  08/04/16 138/62  02/05/16 126/62    She was last seen for hypertension 6 months ago.  BP at that visit was 138/62. Management since that visit includes; labs checked, no changes.She reports good compliance with treatment. She is not having side effects. none She is not exercising. She is adherent to low salt diet.   Outside blood pressures are normal. She is experiencing none.  Patient denies none.   Cardiovascular risk factors include diabetes mellitus.  Use of agents associated with hypertension: none.   ----------------------------------------------------------------    Allergies  Allergen Reactions  . Calcium Channel Blockers Rash  . Norvasc [Amlodipine Besylate] Rash  . Penicillins Rash     Current Outpatient Medications:  .  aspirin 81 MG tablet, Take 81 mg by mouth daily., Disp: , Rfl:  .  atenolol (TENORMIN) 25 MG tablet,  Take 1 tablet by mouth daily., Disp: , Rfl:  .  Cholecalciferol (VITAMIN D-3) 5000 UNITS TABS, Take 1 tablet by mouth every 7 (seven) days., Disp: , Rfl:  .  Cinnamon 500 MG TABS, Take 1 tablet by mouth 2 (two) times daily., Disp: , Rfl:  .  clobetasol cream (TEMOVATE) 3.38 %, Apply 1 application topically 2 (two) times daily. PRN, Disp: , Rfl:  .  hydrochlorothiazide (HYDRODIURIL) 25 MG tablet, Take 1 tablet (25 mg total) by mouth daily., Disp: 90 tablet, Rfl: 4 .  ONE TOUCH ULTRA TEST test strip, CHECK BLOOD SUGAR TWICE A DAY, Disp: 200 each, Rfl: 3  Review of Systems  Constitutional: Negative for appetite change, chills, fatigue and fever.  Respiratory: Negative for chest tightness and shortness of breath.   Cardiovascular: Negative for chest pain and palpitations.  Gastrointestinal: Negative for abdominal pain, nausea and vomiting.  Neurological: Negative for dizziness and weakness.    Social History   Tobacco Use  . Smoking status: Former Smoker    Packs/day: 1.00    Years: 1.00    Pack years: 1.00    Types: Cigarettes    Last attempt to quit: 03/23/1962    Years since quitting: 54.9  . Smokeless tobacco: Never Used  Substance Use Topics  . Alcohol use: No   Objective:   BP 138/84 (BP Location: Right Arm, Patient Position: Sitting, Cuff Size: Normal)   Pulse (!) 53   Temp 97.7 F (36.5 C) (Oral)   Resp 16   Ht 5\' 5"  (1.651 m)   Wt 169 lb (76.7  kg)   SpO2 97%   BMI 28.12 kg/m  Vitals:   02/09/17 0944  BP: 138/84  Pulse: (!) 53  Resp: 16  Temp: 97.7 F (36.5 C)  TempSrc: Oral  SpO2: 97%  Weight: 169 lb (76.7 kg)  Height: 5\' 5"  (1.651 m)   Fall Risk  02/09/2017 02/05/2016 01/31/2015  Falls in the past year? No No No    Depression screen [facility] 2/9 02/09/2017 02/05/2016 01/31/2015  Decreased Interest 0 0 0  Down, Depressed, Hopeless 0 0 0  PHQ - 2 Score 0 0 0  Altered sleeping 0 - -  Tired, decreased energy 0 - -  Change in appetite 0 - -  Feeling bad or  failure about yourself  0 - -  Trouble concentrating 0 - -  Moving slowly or fidgety/restless 0 - -  Suicidal thoughts 0 - -  PHQ-9 Score 0 - -  Difficult doing work/chores Not difficult at all - -    Functional Status Survey: Is the patient deaf or have difficulty hearing?: No Does the patient have difficulty seeing, even when wearing glasses/contacts?: Yes Does the patient have difficulty concentrating, remembering, or making decisions?: No Does the patient have difficulty walking or climbing stairs?: Yes Does the patient have difficulty dressing or bathing?: No Does the patient have difficulty doing errands alone such as visiting a doctor's office or shopping?: No   Physical Exam   General Appearance:    Alert, cooperative, no distress  Eyes:    PERRL, conjunctiva/corneas clear, EOM's intact       Lungs:     Clear to auscultation bilaterally, respirations unlabored  Heart:    Regular rate and rhythm  Neurologic:   Awake, alert, oriented x 3. No apparent focal neurological           defect.       Diabetic Foot Exam - Simple   Simple Foot Form Diabetic Foot exam was performed with the following findings:  Yes 02/09/2017 10:05 AM  Visual Inspection No deformities, no ulcerations, no other skin breakdown bilaterally:  Yes Sensation Testing See comments:  Yes Pulse Check Posterior Tibialis and Dorsalis pulse intact bilaterally:  Yes Comments Diminished sensation throughout plantar aspect of both feet      Results for orders placed or performed in visit on 02/09/17  POCT glycosylated hemoglobin (Hb A1C)  Result Value Ref Range   Hemoglobin A1C 6.2    Est. average glucose Bld gHb Est-mCnc 131   POCT UA - Microalbumin  Result Value Ref Range   Microalbumin Ur, POC negative mg/L       Assessment & Plan:     1. Type 2 diabetes mellitus without complication, without long-term current use of insulin (HCC) Well controlled.  Diet controlled.  - POCT glycosylated  hemoglobin (Hb A1C) - POCT UA - Microalbumin  2. Neuropathy She states Dr. Manuella Ghazi tried her on gabapentin which just made her sleepy. Offered prescription for Lyrica, but she states she can put up with for now.   3. Need for influenza vaccination  - Flu vaccine HIGH DOSE PF    Follow up 6 months.       Lelon Huh, MD  [facility]

## 2017-03-03 ENCOUNTER — Ambulatory Visit (INDEPENDENT_AMBULATORY_CARE_PROVIDER_SITE_OTHER): Payer: PPO

## 2017-03-03 VITALS — BP 138/62 | HR 46 | Temp 99.1°F | Ht 65.0 in | Wt 168.4 lb

## 2017-03-03 DIAGNOSIS — Z1211 Encounter for screening for malignant neoplasm of colon: Secondary | ICD-10-CM | POA: Diagnosis not present

## 2017-03-03 DIAGNOSIS — Z0000 Encounter for general adult medical examination without abnormal findings: Secondary | ICD-10-CM | POA: Diagnosis not present

## 2017-03-03 NOTE — Progress Notes (Signed)
Subjective:   Tracie Gray is a 76 y.o. female who presents for Medicare Annual (Subsequent) preventive examination.  Review of Systems:  N/A  Cardiac Risk Factors include: advanced age (>55men, >2 women);diabetes mellitus;dyslipidemia;female gender     Objective:     Vitals: BP 138/62 (BP Location: Right Arm)   Pulse (!) 46   Temp 99.1 F (37.3 C) (Oral)   Ht 5\' 5"  (1.651 m)   Wt 168 lb 6.4 oz (76.4 kg)   BMI 28.02 kg/m   Body mass index is 28.02 kg/m.  Advanced Directives 03/03/2017 01/31/2015  Does Patient Have a Medical Advance Directive? Yes Yes  Type of Advance Directive Living will;Healthcare Power of [place];Living will  Copy of [place] in Chart? No - copy requested -    Tobacco Social History   Tobacco Use  Smoking Status Former Smoker  . Packs/day: 1.00  . Years: 1.00  . Pack years: 1.00  . Types: Cigarettes  . Last attempt to quit: 03/23/1962  . Years since quitting: 54.9  Smokeless Tobacco Never Used     Counseling given: Not Answered   Clinical Intake:  Pre-visit preparation completed: Yes  Pain : No/denies pain Pain Score: 0-No pain     Nutritional Status: BMI 25 -29 Overweight Nutritional Risks: None Diabetes: Yes CBG done?: No Did pt. bring in CBG monitor from home?: No  Activities of Daily Living: Independent Ambulation: Independent with device- listed below Home Assistive Devices/Equipment: Eyeglasses, Shower/tub chair, Other (Comment)(wears a full set on top and partial dentures on the bottom) Medication Administration: Independent Home Management: Independent  Barriers to Care Management & Learning: None  Do you feel unsafe in your current relationship?: No(married) Do you feel physically threatened by others?: No Anyone hurting you at home, work, or school?: No Unable to ask?: No Information provided on Community resources: No  How often do you need to have someone  help you when you read instructions, pamphlets, or other written materials from your doctor or pharmacy?: 1 - Never What is the last grade level you completed in school?: 12th grade  Interpreter Needed?: No  Information entered by :: [facility], LPN  Past Medical History:  Diagnosis Date  . Arthritic-like pain   . Bilateral cataracts   . 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 Hypertension   . Personal history of colonic polyps-adenomas 12/03/2011  . Thyroid disease    Past Surgical History:  Procedure Laterality Date  . ABDOMINAL HYSTERECTOMY  1981  . CATARACT EXTRACTION Bilateral 2003  . CESAREAN SECTION  1981  . CHOLECYSTECTOMY  1982  . RETINOPATHY SURGERY Right    Family History  Problem Relation Age of Onset  . Cancer Mother   . Coronary artery disease Father   . Heart disease Father   . Heart attack Father   . Heart attack Paternal Grandfather   . Colon cancer Neg Hx   . Rectal cancer Neg Hx   . Stomach cancer Neg Hx   . Esophageal cancer Neg Hx    Social History   Socioeconomic History  . Marital status: Married    Spouse name: Fritz Pickerel  . Number of children: 3  . Years of education: None  . Highest education level: None  Social Needs  . Financial resource strain: None  . Food insecurity - worry: None  . Food insecurity - inability: None  . Transportation needs - medical: None  .  Transportation needs - non-medical: None  Occupational History  . None  Tobacco Use  . Smoking status: Former Smoker    Packs/day: 1.00    Years: 1.00    Pack years: 1.00    Types: Cigarettes    Last attempt to quit: 03/23/1962    Years since quitting: 54.9  . Smokeless tobacco: Never Used  Substance and Sexual Activity  . Alcohol use: No  . Drug use: No  . Sexual activity: None  Other Topics Concern  . None  Social History Narrative  . None    Outpatient Encounter Medications as of 03/03/2017  Medication Sig  . aspirin 81 MG tablet Take 81  mg by mouth daily.  Marland Kitchen atenolol (TENORMIN) 25 MG tablet Take 1 tablet (25 mg total) by mouth daily.  . Cholecalciferol (VITAMIN D-3) 5000 UNITS TABS Take 1 tablet by mouth every 7 (seven) days.  . Cinnamon 500 MG TABS Take 1 tablet by mouth 2 (two) times daily.  . clobetasol cream (TEMOVATE) 8.65 % Apply 1 application topically 2 (two) times daily. PRN  . hydrochlorothiazide (HYDRODIURIL) 25 MG tablet Take 1 tablet (25 mg total) by mouth daily.  . ONE TOUCH ULTRA TEST test strip CHECK BLOOD SUGAR TWICE A DAY  . [DISCONTINUED] Vitamin D, Ergocalciferol, (DRISDOL) 50000 units CAPS capsule Take by mouth.   No facility-administered encounter medications on file as of 03/03/2017.     Activities of Daily Living In your present state of health, do you have any difficulty performing the following activities: 03/03/2017 02/09/2017  Hearing? N N  Vision? N Y  Difficulty concentrating or making decisions? N N  Walking or climbing stairs? Y Y  Comment due to knee pain (both) -  Dressing or bathing? N N  Doing errands, shopping? N N  Preparing Food and eating ? N -  Using the Toilet? N -  In the past six months, have you accidently leaked urine? N -  Do you have problems with loss of bowel control? N -  Managing your Medications? N -  Managing your Finances? N -  Housekeeping or managing your Housekeeping? N -  Some recent data might be hidden    Patient Care Team: Birdie Sons, MD as PCP - General (Family Medicine) Ubaldo Glassing Javier Docker, MD as Consulting Physician (Cardiology) Gatha Mayer, MD as Consulting Physician (Gastroenterology)    Assessment:     Exercise Activities and Dietary recommendations Current Exercise Habits: The patient does not participate in regular exercise at present, Exercise limited by: Other - see comments(due to current weather)  Goals    . DIET - INCREASE WATER INTAKE     Recommend increasing water intake to 4 glasses a day.     . Exercise 150 minutes per  week (moderate activity)      Fall Risk Fall Risk  03/03/2017 02/09/2017 02/05/2016 01/31/2015  Falls in the past year? No No No No   Is the patient's home free of loose throw rugs in walkways, pet beds, electrical cords, etc?   Yes      Grab bars in the bathroom? yes      Handrails on the stairs?   yes      Adequate lighting?   yes  Depression Screen PHQ 2/9 Scores 03/03/2017 02/09/2017 02/05/2016 01/31/2015  PHQ - 2 Score 0 0 0 0  PHQ- 9 Score 1 0 - -     Cognitive Function: Pt declined screening today.      Immunization  History  Administered Date(s) Administered  . Influenza, High Dose Seasonal PF 02/05/2016, 02/09/2017  . Influenza,inj,Quad PF,6+ Mos 01/31/2015  . Pneumococcal Conjugate-13 01/31/2015  . Pneumococcal Polysaccharide-23 11/04/2011  . Tdap 11/04/2011   Screening Tests Health Maintenance  Topic Date Due  . COLONOSCOPY  12/03/2014  . OPHTHALMOLOGY EXAM  04/25/2016  . DEXA SCAN  07/26/2017  . HEMOGLOBIN A1C  08/09/2017  . FOOT EXAM  02/09/2018  . URINE MICROALBUMIN  02/09/2018  . TETANUS/TDAP  11/03/2021  . INFLUENZA VACCINE  Completed  . PNA vac Low Risk Adult  Completed   Cancer Screenings: Lung: Low Dose CT Chest recommended if Age 57-80 years, 30 pack-year currently smoking OR have quit w/in 15years. Patient does not qualify. Breast: Up to date on Mammogram? Yes  Up to date of Bone Density/Dexa? Yes Colorectal: last completed 11/2011, referral sent today  Additional Screenings:  Hepatitis B/HIV/Syphillis: HIV completed, declined other blood work. Hepatitis C Screening: Pt declined today.     Plan:  I have personally reviewed and addressed the Medicare Annual Wellness questionnaire and have noted the following in the patient's chart:  A. Medical and social history B. Use of alcohol, tobacco or illicit drugs  C. Current medications and supplements D. Functional ability and status E.  Nutritional status F.  Physical activity G. Advance  directives H. List of other physicians I.  Hospitalizations, surgeries, and ER visits in previous 12 months J.  [place] such as hearing and vision if needed, cognitive and depression L. Referrals and appointments - none  In addition, I have reviewed and discussed with patient certain preventive protocols, quality metrics, and best practice recommendations. A written personalized care plan for preventive services as well as general preventive health recommendations were provided to patient.  See attached scanned questionnaire for additional information.   Signed,  Fabio Neighbors, LPN Nurse Health Advisor   Nurse Recommendations: Referral for colonoscopy sent today. Pt is supposed to repeat these every 3 years. Pt to set up an eye exam in 2019. Pulse was low today, pt declined s/s. Advised pt to f/u if she experiences any dizziness or becomes lightheaded. Pt has a hx (per chart) of low pulse rates.

## 2017-03-03 NOTE — Patient Instructions (Signed)
Tracie Gray , Thank you for taking time to come for your Medicare Wellness Visit. I appreciate your ongoing commitment to your health goals. Please review the following plan we discussed and let me know if I can assist you in the future.   Screening recommendations/referrals: Colonoscopy: referral sent today Mammogram: up to date Bone Density: up to date Recommended yearly ophthalmology/optometry visit for glaucoma screening and checkup Recommended yearly dental visit for hygiene and checkup  Vaccinations: Influenza vaccine: up to date Pneumococcal vaccine: up to date Tdap vaccine: up to date Shingles vaccine: declined  Advanced directives: Please bring a copy of your POA (Power of Attorney) and/or Living Will to your next appointment.   Conditions/risks identified: Recommend increasing water intake to 4 glasses a day.   Next appointment: Pt declined today.   Preventive Care 32 Years and Older, Female Preventive care refers to lifestyle choices and visits with your health care provider that can promote health and wellness. What does preventive care include?  A yearly physical exam. This is also called an annual well check.  Dental exams once or twice a year.  Routine eye exams. Ask your health care provider how often you should have your eyes checked.  Personal lifestyle choices, including:  Daily care of your teeth and gums.  Regular physical activity.  Eating a healthy diet.  Avoiding tobacco and drug use.  Limiting alcohol use.  Practicing safe sex.  Taking low-dose aspirin every day.  Taking vitamin and mineral supplements as recommended by your health care provider. What happens during an annual well check? The services and screenings done by your health care provider during your annual well check will depend on your age, overall health, lifestyle risk factors, and family history of disease. Counseling  Your health care provider may ask you questions about  your:  Alcohol use.  Tobacco use.  Drug use.  Emotional well-being.  Home and relationship well-being.  Sexual activity.  Eating habits.  History of falls.  Memory and ability to understand (cognition).  Work and work Statistician.  Reproductive health. Screening  You may have the following tests or measurements:  Height, weight, and BMI.  Blood pressure.  Lipid and cholesterol levels. These may be checked every 5 years, or more frequently if you are over 51 years old.  Skin check.  Lung cancer screening. You may have this screening every year starting at age 65 if you have a 30-pack-year history of smoking and currently smoke or have quit within the past 15 years.  Fecal occult blood test (FOBT) of the stool. You may have this test every year starting at age 36.  Flexible sigmoidoscopy or colonoscopy. You may have a sigmoidoscopy every 5 years or a colonoscopy every 10 years starting at age 51.  Hepatitis C blood test.  Hepatitis B blood test.  Sexually transmitted disease (STD) testing.  Diabetes screening. This is done by checking your blood sugar (glucose) after you have not eaten for a while (fasting). You may have this done every 1-3 years.  Bone density scan. This is done to screen for osteoporosis. You may have this done starting at age 97.  Mammogram. This may be done every 1-2 years. Talk to your health care provider about how often you should have regular mammograms. Talk with your health care provider about your test results, treatment options, and if necessary, the need for more tests. Vaccines  Your health care provider may recommend certain vaccines, such as:  Influenza vaccine. This is  recommended every year.  Tetanus, diphtheria, and acellular pertussis (Tdap, Td) vaccine. You may need a Td booster every 10 years.  Zoster vaccine. You may need this after age 29.  Pneumococcal 13-valent conjugate (PCV13) vaccine. One dose is recommended  after age 1.  Pneumococcal polysaccharide (PPSV23) vaccine. One dose is recommended after age 55. Talk to your health care provider about which screenings and vaccines you need and how often you need them. This information is not intended to replace advice given to you by your health care provider. Make sure you discuss any questions you have with your health care provider. Document Released: 04/05/2015 Document Revised: 11/27/2015 Document Reviewed: 01/08/2015 Elsevier Interactive Patient Education  2017 [place] Prevention in the Home Falls can cause injuries. They can happen to people of all ages. There are many things you can do to make your home safe and to help prevent falls. What can I do on the outside of my home?  Regularly fix the edges of walkways and driveways and fix any cracks.  Remove anything that might make you trip as you walk through a door, such as a raised step or threshold.  Trim any bushes or trees on the path to your home.  Use bright outdoor lighting.  Clear any walking paths of anything that might make someone trip, such as rocks or tools.  Regularly check to see if handrails are loose or broken. Make sure that both sides of any steps have handrails.  Any raised decks and porches should have guardrails on the edges.  Have any leaves, snow, or ice cleared regularly.  Use sand or salt on walking paths during winter.  Clean up any spills in your garage right away. This includes oil or grease spills. What can I do in the bathroom?  Use night lights.  Install grab bars by the toilet and in the tub and shower. Do not use towel bars as grab bars.  Use non-skid mats or decals in the tub or shower.  If you need to sit down in the shower, use a plastic, non-slip stool.  Keep the floor dry. Clean up any water that spills on the floor as soon as it happens.  Remove soap buildup in the tub or shower regularly.  Attach bath mats securely with  double-sided non-slip rug tape.  Do not have throw rugs and other things on the floor that can make you trip. What can I do in the bedroom?  Use night lights.  Make sure that you have a light by your bed that is easy to reach.  Do not use any sheets or blankets that are too big for your bed. They should not hang down onto the floor.  Have a firm chair that has side arms. You can use this for support while you get dressed.  Do not have throw rugs and other things on the floor that can make you trip. What can I do in the kitchen?  Clean up any spills right away.  Avoid walking on wet floors.  Keep items that you use a lot in easy-to-reach places.  If you need to reach something above you, use a strong step stool that has a grab bar.  Keep electrical cords out of the way.  Do not use floor polish or wax that makes floors slippery. If you must use wax, use non-skid floor wax.  Do not have throw rugs and other things on the floor that can make you trip.  What can I do with my stairs?  Do not leave any items on the stairs.  Make sure that there are handrails on both sides of the stairs and use them. Fix handrails that are broken or loose. Make sure that handrails are as long as the stairways.  Check any carpeting to make sure that it is firmly attached to the stairs. Fix any carpet that is loose or worn.  Avoid having throw rugs at the top or bottom of the stairs. If you do have throw rugs, attach them to the floor with carpet tape.  Make sure that you have a light switch at the top of the stairs and the bottom of the stairs. If you do not have them, ask someone to add them for you. What else can I do to help prevent falls?  Wear shoes that:  Do not have high heels.  Have rubber bottoms.  Are comfortable and fit you well.  Are closed at the toe. Do not wear sandals.  If you use a stepladder:  Make sure that it is fully opened. Do not climb a closed stepladder.  Make  sure that both sides of the stepladder are locked into place.  Ask someone to hold it for you, if possible.  Clearly mark and make sure that you can see:  Any grab bars or handrails.  First and last steps.  Where the edge of each step is.  Use tools that help you move around (mobility aids) if they are needed. These include:  Canes.  Walkers.  Scooters.  Crutches.  Turn on the lights when you go into a dark area. Replace any light bulbs as soon as they burn out.  Set up your furniture so you have a clear path. Avoid moving your furniture around.  If any of your floors are uneven, fix them.  If there are any pets around you, be aware of where they are.  Review your medicines with your doctor. Some medicines can make you feel dizzy. This can increase your chance of falling. Ask your doctor what other things that you can do to help prevent falls. This information is not intended to replace advice given to you by your health care provider. Make sure you discuss any questions you have with your health care provider. Document Released: 01/03/2009 Document Revised: 08/15/2015 Document Reviewed: 04/13/2014 Elsevier Interactive Patient Education  2017 [employer].

## 2017-03-10 NOTE — Telephone Encounter (Signed)
Visit complete.

## 2017-04-03 ENCOUNTER — Other Ambulatory Visit: Payer: Self-pay | Admitting: Family Medicine

## 2017-04-03 DIAGNOSIS — E119 Type 2 diabetes mellitus without complications: Secondary | ICD-10-CM

## 2017-04-05 ENCOUNTER — Encounter: Payer: Self-pay | Admitting: Family Medicine

## 2017-04-16 DIAGNOSIS — E119 Type 2 diabetes mellitus without complications: Secondary | ICD-10-CM | POA: Diagnosis not present

## 2017-04-16 DIAGNOSIS — I10 Essential (primary) hypertension: Secondary | ICD-10-CM | POA: Diagnosis not present

## 2017-04-16 DIAGNOSIS — Z794 Long term (current) use of insulin: Secondary | ICD-10-CM | POA: Diagnosis not present

## 2017-04-23 ENCOUNTER — Ambulatory Visit (INDEPENDENT_AMBULATORY_CARE_PROVIDER_SITE_OTHER): Payer: PPO | Admitting: Family Medicine

## 2017-04-23 ENCOUNTER — Encounter: Payer: Self-pay | Admitting: Family Medicine

## 2017-04-23 VITALS — BP 134/70 | HR 52 | Temp 98.9°F | Resp 16 | Wt 167.0 lb

## 2017-04-23 DIAGNOSIS — J069 Acute upper respiratory infection, unspecified: Secondary | ICD-10-CM | POA: Diagnosis not present

## 2017-04-23 DIAGNOSIS — R05 Cough: Secondary | ICD-10-CM

## 2017-04-23 DIAGNOSIS — R059 Cough, unspecified: Secondary | ICD-10-CM

## 2017-04-23 MED ORDER — HYDROCODONE-HOMATROPINE 5-1.5 MG/5ML PO SYRP: 5.0000 mL | ORAL_SOLUTION | Freq: Three times a day (TID) | ORAL | 0 refills | Status: DC | PRN

## 2017-04-23 NOTE — Progress Notes (Signed)
Patient: Tracie Gray Female    DOB: 04-Jun-1940   77 y.o.   MRN: [id number] Visit Date: 04/23/2017  Today's Provider: Lelon Huh, MD   Chief Complaint  Patient presents with  . Cough   Subjective:    Patient has had a cough for 4 days. Cough is non-productive. Other symptoms include: cough, chest congestion, wheezing, muscle ache in arms. Patient has been taking mucinex with mild relief.    Cough  This is a new problem. The current episode started in the past 7 days (4 days ). The problem has been unchanged. The problem occurs every few minutes. The cough is non-productive. Associated symptoms include headaches, myalgias and wheezing. Pertinent negatives include no chest pain, chills, ear congestion, ear pain, fever, heartburn, hemoptysis, nasal congestion, postnasal drip, rash, rhinorrhea, sore throat, shortness of breath, sweats or weight loss. The symptoms are aggravated by lying down and exercise. Treatments tried: mucinex. The treatment provided mild relief. Her past medical history is significant for bronchitis. There is no history of asthma, bronchiectasis, COPD, emphysema, environmental allergies or pneumonia.   Coughs at night, but doesn't keep her up. Nasal drainage is clear, no blood.    Allergies  Allergen Reactions  . Calcium Channel Blockers Rash  . Norvasc [Amlodipine Besylate] Rash  . Penicillins Rash     Current Outpatient Medications:  .  aspirin 81 MG tablet, Take 81 mg by mouth daily., Disp: , Rfl:  .  atenolol (TENORMIN) 25 MG tablet, Take 1 tablet (25 mg total) by mouth daily., Disp: 90 tablet, Rfl: 4 .  Cholecalciferol (VITAMIN D-3) 5000 UNITS TABS, Take 1 tablet by mouth every 7 (seven) days., Disp: , Rfl:  .  Cinnamon 500 MG TABS, Take 1 tablet by mouth 2 (two) times daily., Disp: , Rfl:  .  clobetasol cream (TEMOVATE) 1.93 %, Apply 1 application topically 2 (two) times daily. PRN, Disp: , Rfl:  .  hydrochlorothiazide (HYDRODIURIL) 25 MG  tablet, Take 1 tablet (25 mg total) by mouth daily., Disp: 90 tablet, Rfl: 4 .  ONE TOUCH ULTRA TEST test strip, CHECK BLOOD SUGAR TWICE A DAY, Disp: 200 each, Rfl: 4  Review of Systems  Constitutional: Negative for appetite change, chills, fatigue, fever and weight loss.  HENT: Positive for congestion. Negative for ear pain, postnasal drip, rhinorrhea and sore throat.   Respiratory: Positive for cough and wheezing. Negative for hemoptysis, chest tightness and shortness of breath.   Cardiovascular: Negative for chest pain and palpitations.  Gastrointestinal: Negative for abdominal pain, heartburn, nausea and vomiting.  Musculoskeletal: Positive for myalgias.  Skin: Negative for rash.  Allergic/Immunologic: Negative for environmental allergies.  Neurological: Positive for headaches. Negative for dizziness and weakness.    Social History   Tobacco Use  . Smoking status: Former Smoker    Packs/day: 1.00    Years: 1.00    Pack years: 1.00    Types: Cigarettes    Last attempt to quit: 03/23/1962    Years since quitting: 55.1  . Smokeless tobacco: Never Used  Substance Use Topics  . Alcohol use: No   Objective:   BP 134/70 (BP Location: Right Arm, Patient Position: Sitting, Cuff Size: Normal)   Pulse (!) 52   Temp 98.9 F (37.2 C) (Oral)   Resp 16   Wt 167 lb (75.8 kg)   SpO2 98%   BMI 27.79 kg/m     Physical Exam  General Appearance:    Alert, cooperative, no distress  HENT:   bilateral TM normal without fluid or infection, neck without nodes, sinuses nontender, post nasal drip noted, nasal mucosa congested and nasal mucosa pale and congested  Eyes:    PERRL, conjunctiva/corneas clear, EOM's intact       Lungs:     Clear to auscultation bilaterally, respirations unlabored  Heart:    Regular rate and rhythm  Neurologic:   Awake, alert, oriented x 3. No apparent focal neurological           defect.           Assessment & Plan:     1. Upper respiratory tract infection,  unspecified type Counseled regarding signs and symptoms of viral and bacterial respiratory infections. Advised to call or return for additional evaluation if she develops any sign of bacterial infection, or if current symptoms last longer than 10 days.    2. Cough  - HYDROcodone-homatropine (HYCODAN) 5-1.5 MG/5ML syrup; Take 5 mLs by mouth every 8 (eight) hours as needed.  Dispense: 100 mL; Refill: 0       Lelon Huh, MD  [facility]

## 2017-04-23 NOTE — Patient Instructions (Signed)
   Start using OTC saline nasal every 2-3 hours during the day   Call if you develop any fever over 101, shortness of breath, or worsening pain in your sinuses

## 2017-10-11 DIAGNOSIS — I10 Essential (primary) hypertension: Secondary | ICD-10-CM | POA: Diagnosis not present

## 2017-10-11 DIAGNOSIS — E119 Type 2 diabetes mellitus without complications: Secondary | ICD-10-CM | POA: Diagnosis not present

## 2017-10-27 ENCOUNTER — Other Ambulatory Visit: Payer: Self-pay | Admitting: Family Medicine

## 2017-10-27 DIAGNOSIS — I10 Essential (primary) hypertension: Secondary | ICD-10-CM

## 2017-12-27 ENCOUNTER — Encounter: Payer: Self-pay | Admitting: Family Medicine

## 2017-12-27 DIAGNOSIS — H524 Presbyopia: Secondary | ICD-10-CM | POA: Diagnosis not present

## 2018-01-21 ENCOUNTER — Other Ambulatory Visit: Payer: Self-pay | Admitting: Family Medicine

## 2018-01-21 DIAGNOSIS — I10 Essential (primary) hypertension: Secondary | ICD-10-CM

## 2018-03-08 ENCOUNTER — Ambulatory Visit (INDEPENDENT_AMBULATORY_CARE_PROVIDER_SITE_OTHER): Payer: PPO

## 2018-03-08 VITALS — BP 140/64 | HR 52 | Temp 99.2°F | Ht 65.0 in | Wt 166.2 lb

## 2018-03-08 DIAGNOSIS — Z23 Encounter for immunization: Secondary | ICD-10-CM

## 2018-03-08 DIAGNOSIS — Z0000 Encounter for general adult medical examination without abnormal findings: Secondary | ICD-10-CM | POA: Diagnosis not present

## 2018-03-08 NOTE — Progress Notes (Signed)
Subjective:   Tracie Gray is a 77 y.o. female who presents for Medicare Annual (Subsequent) preventive examination.  Review of Systems:  N/A  Cardiac Risk Factors include: advanced age (>94men, >81 women);diabetes mellitus;dyslipidemia     Objective:     Vitals: BP 140/64 (BP Location: Left Arm)   Pulse (!) 52   Temp 99.2 F (37.3 C) (Oral)   Ht 5\' 5"  (1.651 m)   Wt 166 lb 3.2 oz (75.4 kg)   BMI 27.66 kg/m   Body mass index is 27.66 kg/m.  Advanced Directives 03/08/2018 03/03/2017 01/31/2015  Does Patient Have a Medical Advance Directive? Yes Yes Yes  Type of Paramedic of [place];Living will Living will;Healthcare Power of [place];Living will  Copy of [place] in Chart? No - copy requested No - copy requested -    Tobacco Social History   Tobacco Use  Smoking Status Former Smoker  . Packs/day: 1.00  . Years: 1.00  . Pack years: 1.00  . Types: Cigarettes  . Last attempt to quit: 03/23/1962  . Years since quitting: 55.9  Smokeless Tobacco Never Used     Counseling given: Not Answered   Clinical Intake:  Pre-visit preparation completed: Yes  Pain : No/denies pain Pain Score: 0-No pain    Diabetes:  Is the patient diabetic?  Yes  If diabetic, was a CBG obtained today?  No  Did the patient bring in their glucometer from home?  No  How often do you monitor your CBG's? Once daily.   Financial Strains and Diabetes Management:  Are you having any financial strains with the device, your supplies or your medication? No .  Does the patient want to be seen by Chronic Care Management for management of their diabetes?  No  Would the patient like to be referred to a Nutritionist or for Diabetic Management?  No    Diabetic Exams:  Diabetic Eye Exam: Completed 01/2018.   Diabetic Foot Exam: Completed 02/09/17. Pt has been advised about the importance in completing this exam. Note  made to f/u on this at next OV.    Nutritional Status: BMI 25 -29 Overweight Nutritional Risks: None   How often do you need to have someone help you when you read instructions, pamphlets, or other written materials from your doctor or pharmacy?: 1 - Never  Interpreter Needed?: No  Information entered by :: [facility], LPN  Past Medical History:  Diagnosis Date  . Arthritic-like pain   . Bilateral cataracts   . 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 Hypertension   . Personal history of colonic polyps-adenomas 12/03/2011  . Thyroid disease    Past Surgical History:  Procedure Laterality Date  . ABDOMINAL HYSTERECTOMY  1981  . CATARACT EXTRACTION Bilateral 2003  . CESAREAN SECTION  1981  . CHOLECYSTECTOMY  1982  . RETINOPATHY SURGERY Right    Family History  Problem Relation Age of Onset  . Cancer Mother   . Coronary artery disease Father   . Heart disease Father   . Heart attack Father   . Heart attack Paternal Grandfather   . Colon cancer Neg Hx   . Rectal cancer Neg Hx   . Stomach cancer Neg Hx   . Esophageal cancer Neg Hx    Social History   Socioeconomic History  . Marital status: Married    Spouse name: Fritz Pickerel  . Number of children: 3  .  Years of education: Not on file  . Highest education level: Some college, no degree  Occupational History  . Occupation: retired  Scientific laboratory technician  . Financial resource strain: Not hard at all  . Food insecurity:    Worry: Never true    Inability: Never true  . Transportation needs:    Medical: No    Non-medical: No  Tobacco Use  . Smoking status: Former Smoker    Packs/day: 1.00    Years: 1.00    Pack years: 1.00    Types: Cigarettes    Last attempt to quit: 03/23/1962    Years since quitting: 55.9  . Smokeless tobacco: Never Used  Substance and Sexual Activity  . Alcohol use: No  . Drug use: No  . Sexual activity: Not on file  Lifestyle  . Physical activity:    Days per week: 0 days      Minutes per session: 0 min  . Stress: Only a little  Relationships  . Social connections:    Talks on phone: Patient refused    Gets together: Patient refused    Attends religious service: Patient refused    Active member of club or organization: Patient refused    Attends meetings of clubs or organizations: Patient refused    Relationship status: Patient refused  Other Topics Concern  . Not on file  Social History Narrative  . Not on file    Outpatient Encounter Medications as of 03/08/2018  Medication Sig  . aspirin 81 MG tablet Take 81 mg by mouth daily.  Marland Kitchen atenolol (TENORMIN) 25 MG tablet Take 1 tablet (25 mg total) by mouth daily.  . Cholecalciferol (VITAMIN D-3) 5000 UNITS TABS Take 1 tablet by mouth every 7 (seven) days.  . clobetasol cream (TEMOVATE) 6.22 % Apply 1 application topically 2 (two) times daily. PRN  . hydrochlorothiazide (HYDRODIURIL) 25 MG tablet TAKE 1 TABLET BY MOUTH ONCE A DAY  . ONE TOUCH ULTRA TEST test strip CHECK BLOOD SUGAR TWICE A DAY  . Cinnamon 500 MG TABS Take 1 tablet by mouth 2 (two) times daily.  Marland Kitchen HYDROcodone-homatropine (HYCODAN) 5-1.5 MG/5ML syrup Take 5 mLs by mouth every 8 (eight) hours as needed. (Patient not taking: Reported on 03/08/2018)   No facility-administered encounter medications on file as of 03/08/2018.     Activities of Daily Living In your present state of health, do you have any difficulty performing the following activities: 03/08/2018  Hearing? N  Vision? N  Comment Wears eye glasses.   Difficulty concentrating or making decisions? N  Walking or climbing stairs? Y  Comment Due to knee pains and neuropathy.  Dressing or bathing? N  Doing errands, shopping? N  Preparing Food and eating ? N  Using the Toilet? N  In the past six months, have you accidently leaked urine? N  Do you have problems with loss of bowel control? N  Managing your Medications? N  Managing your Finances? N  Housekeeping or managing your  Housekeeping? N  Some recent data might be hidden    Patient Care Team: Birdie Sons, MD as PCP - General (Family Medicine) Ubaldo Glassing Javier Docker, MD as Consulting Physician (Cardiology) Gatha Mayer, MD as Consulting Physician (Gastroenterology)    Assessment:   This is a routine wellness examination for [place].  Exercise Activities and Dietary recommendations Current Exercise Habits: The patient does not participate in regular exercise at present, Exercise limited by: None identified  Goals    . DIET -  INCREASE WATER INTAKE     Recommend increasing water intake to 4 glasses a day.     . Exercise 150 minutes per week (moderate activity)       Fall Risk Fall Risk  03/08/2018 03/03/2017 02/09/2017 02/05/2016 01/31/2015  Falls in the past year? 0 No No No No  Number falls in past yr: 0 - - - -  Injury with Fall? 0 - - - -   FALL RISK PREVENTION PERTAINING TO THE HOME:  Any stairs in or around the home WITH handrails? No  Home free of loose throw rugs in walkways, pet beds, electrical cords, etc? Yes  Adequate lighting in your home to reduce risk of falls? Yes   ASSISTIVE DEVICES UTILIZED TO PREVENT FALLS:  Life alert? No  Use of a cane, walker or w/c? Yes  Grab bars in the bathroom? Yes  Shower chair or bench in shower? Yes  Elevated toilet seat or a handicapped toilet? Yes    TIMED UP AND GO:  Was the test performed? No .    Depression Screen PHQ 2/9 Scores 03/08/2018 03/03/2017 02/09/2017 02/05/2016  PHQ - 2 Score 0 0 0 0  PHQ- 9 Score - 1 0 -     Cognitive Function: Declined today.         Immunization History  Administered Date(s) Administered  . Influenza, High Dose Seasonal PF 02/05/2016, 02/09/2017  . Influenza,inj,Quad PF,6+ Mos 01/31/2015  . Pneumococcal Conjugate-13 01/31/2015  . Pneumococcal Polysaccharide-23 11/04/2011  . Tdap 11/04/2011    Qualifies for Shingles Vaccine? Yes . Due for Shingrix. Education has been provided regarding the  importance of this vaccine. Pt has been advised to call insurance company to determine out of pocket expense. Advised may also receive vaccine at local pharmacy or Health Dept. Verbalized acceptance and understanding.  Tdap: Up to date  Flu Vaccine: Due for Flu vaccine. Does the patient want to receive this vaccine today?  Yes .   Pneumococcal Vaccine: Up to date   Screening Tests Health Maintenance  Topic Date Due  . COLONOSCOPY  12/03/2014  . OPHTHALMOLOGY EXAM  04/25/2016  . HEMOGLOBIN A1C  08/09/2017  . FOOT EXAM  02/09/2018  . URINE MICROALBUMIN  02/09/2018  . TETANUS/TDAP  11/03/2021  . DEXA SCAN  12/01/2023  . INFLUENZA VACCINE  Completed  . PNA vac Low Risk Adult  Completed    Cancer Screenings:  Colorectal Screening: Completed 12/03/11. Repeat every 5 years; Declined referral today.   Mammogram: No longer required.   Bone Density: Completed 07/27/14. Results reflect NORMAL. Repeat every 10 years.   Lung Cancer Screening: (Low Dose CT Chest recommended if Age 15-80 years, 30 pack-year currently smoking OR have quit w/in 15years.) does not qualify.    Additional Screening:  Vision Screening: Recommended annual ophthalmology exams for early detection of glaucoma and other disorders of the eye.  Dental Screening: Recommended annual dental exams for proper oral hygiene  Community Resource Referral:  CRR required this visit?  No       Plan:  I have personally reviewed and addressed the Medicare Annual Wellness questionnaire and have noted the following in the patient's chart:  A. Medical and social history B. Use of alcohol, tobacco or illicit drugs  C. Current medications and supplements D. Functional ability and status E.  Nutritional status F.  Physical activity G. Advance directives H. List of other physicians I.  Hospitalizations, surgeries, and ER visits in previous 12 months J.  Vitals K.  Screenings such as hearing and vision if needed, cognitive  and depression L. Referrals and appointments - none  In addition, I have reviewed and discussed with patient certain preventive protocols, quality metrics, and best practice recommendations. A written personalized care plan for preventive services as well as general preventive health recommendations were provided to patient.  See attached scanned questionnaire for additional information.   Signed,  Fabio Neighbors, LPN Nurse Health Advisor   Nurse Recommendations: Pt needs her Hgb A1 checked, urine check and foot exam at next OV. Pt declined a colonoscopy referral. Pt to have eye exam notes sent to the clinic.

## 2018-03-08 NOTE — Patient Instructions (Addendum)
Tracie Gray , Thank you for taking time to come for your Medicare Wellness Visit. I appreciate your ongoing commitment to your health goals. Please review the following plan we discussed and let me know if I can assist you in the future.   Screening recommendations/referrals: Colonoscopy: Pt declined referral today. Mammogram: No longer required.  Bone Density: Up to date, due 07/2024 Recommended yearly ophthalmology/optometry visit for glaucoma screening and checkup Recommended yearly dental visit for hygiene and checkup  Vaccinations: Influenza vaccine: Administered today.  Pneumococcal vaccine: Completed series Tdap vaccine: Up to date, due 10/2021 Shingles vaccine: Pt declines today.     Advanced directives: Please bring a copy of your POA (Power of Attorney) and/or Living Will to your next appointment.   Conditions/risks identified: Continue increasing water intake to 6-8 eight oz glasses a day.   Next appointment: 04/20/18 @ 10:00 AM with Dr Caryn Section.    Preventive Care 78 Years and Older, Female Preventive care refers to lifestyle choices and visits with your health care provider that can promote health and wellness. What does preventive care include?  A yearly physical exam. This is also called an annual well check.  Dental exams once or twice a year.  Routine eye exams. Ask your health care provider how often you should have your eyes checked.  Personal lifestyle choices, including:  Daily care of your teeth and gums.  Regular physical activity.  Eating a healthy diet.  Avoiding tobacco and drug use.  Limiting alcohol use.  Practicing safe sex.  Taking low-dose aspirin every day.  Taking vitamin and mineral supplements as recommended by your health care provider. What happens during an annual well check? The services and screenings done by your health care provider during your annual well check will depend on your age, overall health, lifestyle risk  factors, and family history of disease. Counseling  Your health care provider may ask you questions about your:  Alcohol use.  Tobacco use.  Drug use.  Emotional well-being.  Home and relationship well-being.  Sexual activity.  Eating habits.  History of falls.  Memory and ability to understand (cognition).  Work and work Statistician.  Reproductive health. Screening  You may have the following tests or measurements:  Height, weight, and BMI.  Blood pressure.  Lipid and cholesterol levels. These may be checked every 5 years, or more frequently if you are over 45 years old.  Skin check.  Lung cancer screening. You may have this screening every year starting at age 78 if you have a 30-pack-year history of smoking and currently smoke or have quit within the past 15 years.  Fecal occult blood test (FOBT) of the stool. You may have this test every year starting at age 78.  Flexible sigmoidoscopy or colonoscopy. You may have a sigmoidoscopy every 5 years or a colonoscopy every 10 years starting at age 78.  Hepatitis C blood test.  Hepatitis B blood test.  Sexually transmitted disease (STD) testing.  Diabetes screening. This is done by checking your blood sugar (glucose) after you have not eaten for a while (fasting). You may have this done every 1-3 years.  Bone density scan. This is done to screen for osteoporosis. You may have this done starting at age 78.  Mammogram. This may be done every 1-2 years. Talk to your health care provider about how often you should have regular mammograms. Talk with your health care provider about your test results, treatment options, and if necessary, the need for more tests.  Vaccines  Your health care provider may recommend certain vaccines, such as:  Influenza vaccine. This is recommended every year.  Tetanus, diphtheria, and acellular pertussis (Tdap, Td) vaccine. You may need a Td booster every 10 years.  Zoster vaccine. You  may need this after age 57.  Pneumococcal 13-valent conjugate (PCV13) vaccine. One dose is recommended after age 95.  Pneumococcal polysaccharide (PPSV23) vaccine. One dose is recommended after age 60. Talk to your health care provider about which screenings and vaccines you need and how often you need them. This information is not intended to replace advice given to you by your health care provider. Make sure you discuss any questions you have with your health care provider. Document Released: 04/05/2015 Document Revised: 11/27/2015 Document Reviewed: 01/08/2015 Elsevier Interactive Patient Education  2017 [place] Prevention in the Home Falls can cause injuries. They can happen to people of all ages. There are many things you can do to make your home safe and to help prevent falls. What can I do on the outside of my home?  Regularly fix the edges of walkways and driveways and fix any cracks.  Remove anything that might make you trip as you walk through a door, such as a raised step or threshold.  Trim any bushes or trees on the path to your home.  Use bright outdoor lighting.  Clear any walking paths of anything that might make someone trip, such as rocks or tools.  Regularly check to see if handrails are loose or broken. Make sure that both sides of any steps have handrails.  Any raised decks and porches should have guardrails on the edges.  Have any leaves, snow, or ice cleared regularly.  Use sand or salt on walking paths during winter.  Clean up any spills in your garage right away. This includes oil or grease spills. What can I do in the bathroom?  Use night lights.  Install grab bars by the toilet and in the tub and shower. Do not use towel bars as grab bars.  Use non-skid mats or decals in the tub or shower.  If you need to sit down in the shower, use a plastic, non-slip stool.  Keep the floor dry. Clean up any water that spills on the floor as soon as  it happens.  Remove soap buildup in the tub or shower regularly.  Attach bath mats securely with double-sided non-slip rug tape.  Do not have throw rugs and other things on the floor that can make you trip. What can I do in the bedroom?  Use night lights.  Make sure that you have a light by your bed that is easy to reach.  Do not use any sheets or blankets that are too big for your bed. They should not hang down onto the floor.  Have a firm chair that has side arms. You can use this for support while you get dressed.  Do not have throw rugs and other things on the floor that can make you trip. What can I do in the kitchen?  Clean up any spills right away.  Avoid walking on wet floors.  Keep items that you use a lot in easy-to-reach places.  If you need to reach something above you, use a strong step stool that has a grab bar.  Keep electrical cords out of the way.  Do not use floor polish or wax that makes floors slippery. If you must use wax, use non-skid floor wax.  Do not have throw rugs and other things on the floor that can make you trip. What can I do with my stairs?  Do not leave any items on the stairs.  Make sure that there are handrails on both sides of the stairs and use them. Fix handrails that are broken or loose. Make sure that handrails are as long as the stairways.  Check any carpeting to make sure that it is firmly attached to the stairs. Fix any carpet that is loose or worn.  Avoid having throw rugs at the top or bottom of the stairs. If you do have throw rugs, attach them to the floor with carpet tape.  Make sure that you have a light switch at the top of the stairs and the bottom of the stairs. If you do not have them, ask someone to add them for you. What else can I do to help prevent falls?  Wear shoes that:  Do not have high heels.  Have rubber bottoms.  Are comfortable and fit you well.  Are closed at the toe. Do not wear sandals.  If  you use a stepladder:  Make sure that it is fully opened. Do not climb a closed stepladder.  Make sure that both sides of the stepladder are locked into place.  Ask someone to hold it for you, if possible.  Clearly mark and make sure that you can see:  Any grab bars or handrails.  First and last steps.  Where the edge of each step is.  Use tools that help you move around (mobility aids) if they are needed. These include:  Canes.  Walkers.  Scooters.  Crutches.  Turn on the lights when you go into a dark area. Replace any light bulbs as soon as they burn out.  Set up your furniture so you have a clear path. Avoid moving your furniture around.  If any of your floors are uneven, fix them.  If there are any pets around you, be aware of where they are.  Review your medicines with your doctor. Some medicines can make you feel dizzy. This can increase your chance of falling. Ask your doctor what other things that you can do to help prevent falls. This information is not intended to replace advice given to you by your health care provider. Make sure you discuss any questions you have with your health care provider. Document Released: 01/03/2009 Document Revised: 08/15/2015 Document Reviewed: 04/13/2014 Elsevier Interactive Patient Education  2017 [employer].

## 2018-04-20 ENCOUNTER — Other Ambulatory Visit: Payer: Self-pay

## 2018-04-20 ENCOUNTER — Ambulatory Visit (INDEPENDENT_AMBULATORY_CARE_PROVIDER_SITE_OTHER): Payer: PPO | Admitting: Family Medicine

## 2018-04-20 ENCOUNTER — Encounter: Payer: Self-pay | Admitting: Family Medicine

## 2018-04-20 VITALS — BP 142/72 | HR 48 | Temp 98.2°F | Ht 64.0 in | Wt 166.0 lb

## 2018-04-20 DIAGNOSIS — Z1239 Encounter for other screening for malignant neoplasm of breast: Secondary | ICD-10-CM

## 2018-04-20 DIAGNOSIS — Z0000 Encounter for general adult medical examination without abnormal findings: Secondary | ICD-10-CM

## 2018-04-20 DIAGNOSIS — E785 Hyperlipidemia, unspecified: Secondary | ICD-10-CM

## 2018-04-20 DIAGNOSIS — Z86010 Personal history of colonic polyps: Secondary | ICD-10-CM | POA: Diagnosis not present

## 2018-04-20 DIAGNOSIS — E119 Type 2 diabetes mellitus without complications: Secondary | ICD-10-CM | POA: Diagnosis not present

## 2018-04-20 DIAGNOSIS — N183 Chronic kidney disease, stage 3 unspecified: Secondary | ICD-10-CM

## 2018-04-20 DIAGNOSIS — I129 Hypertensive chronic kidney disease with stage 1 through stage 4 chronic kidney disease, or unspecified chronic kidney disease: Secondary | ICD-10-CM | POA: Diagnosis not present

## 2018-04-20 DIAGNOSIS — E2839 Other primary ovarian failure: Secondary | ICD-10-CM | POA: Diagnosis not present

## 2018-04-20 DIAGNOSIS — E559 Vitamin D deficiency, unspecified: Secondary | ICD-10-CM | POA: Diagnosis not present

## 2018-04-20 LAB — POCT GLYCOSYLATED HEMOGLOBIN (HGB A1C): HEMOGLOBIN A1C: 6.5 % — AB (ref 4.0–5.6)

## 2018-04-20 NOTE — Patient Instructions (Addendum)
.   Please review the attached list of medications and notify my office if there are any errors.   . Please bring all of your medications to every appointment so we can make sure that our medication list is the same as yours.    The CDC recommends two doses of Shingrix (the shingles vaccine) separated by 2 to 6 months for adults age 79 years and older. I recommend checking with your pharmacy plan regarding coverage for this vaccine.   . Please call the [facility] [phone]) to schedule a routine screening mammogram.   You are due for colonoscopy to follow up on polyps you had in 2013. Please call Dr. Celesta Aver office at [phone] to schedule

## 2018-04-20 NOTE — Progress Notes (Signed)
Patient: EVYNN BOUTELLE, Female    DOB: [birth year], 78 y.o.   MRN: [id number] Visit Date: 04/20/2018  Today's Provider: Lelon Huh, MD   Chief Complaint  Patient presents with  . Annual Exam   Subjective:     Complete Physical CANDANCE BOHLMAN is a 78 y.o. female. She feels well. She reports exercising not much. She reports she is sleeping well.  -----------------------------------------------------------   Review of Systems  Constitutional: Negative.   HENT: Positive for tinnitus.   Eyes: Negative.   Respiratory: Negative.   Cardiovascular: Negative.   Gastrointestinal: Negative.   Endocrine: Negative.   Genitourinary: Negative.   Musculoskeletal: Negative.   Skin: Negative.   Allergic/Immunologic: Negative.   Neurological: Positive for numbness (both feet).  Hematological: Negative.   Psychiatric/Behavioral: Negative.     Social History   Socioeconomic History  . Marital status: Married    Spouse name: Fritz Pickerel  . Number of children: 3  . Years of education: Not on file  . Highest education level: Some college, no degree  Occupational History  . Occupation: retired  Scientific laboratory technician  . Financial resource strain: Not hard at all  . Food insecurity:    Worry: Never true    Inability: Never true  . Transportation needs:    Medical: No    Non-medical: No  Tobacco Use  . Smoking status: Former Smoker    Packs/day: 1.00    Years: 1.00    Pack years: 1.00    Types: Cigarettes    Last attempt to quit: 03/23/1962    Years since quitting: 56.1  . Smokeless tobacco: Never Used  Substance and Sexual Activity  . Alcohol use: No  . Drug use: No  . Sexual activity: Not on file  Lifestyle  . Physical activity:    Days per week: 0 days    Minutes per session: 0 min  . Stress: Only a little  Relationships  . Social connections:    Talks on phone: Patient refused    Gets together: Patient refused    Attends religious service: Patient refused   Active member of club or organization: Patient refused    Attends meetings of clubs or organizations: Patient refused    Relationship status: Patient refused  . Intimate partner violence:    Fear of current or ex partner: Patient refused    Emotionally abused: Patient refused    Physically abused: Patient refused    Forced sexual activity: Patient refused  Other Topics Concern  . Not on file  Social History Narrative  . Not on file    Past Medical History:  Diagnosis Date  . Arthritic-like pain   . Bilateral cataracts   . 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 Hypertension   . Personal history of colonic polyps-adenomas 12/03/2011  . Thyroid disease      Patient Active Problem List   Diagnosis Date Noted  . Hyperlipidemia 09/28/2014  . Benign hypertension with chronic kidney disease, stage III ([place]) 07/27/2014  . ABG (arterial blood gas) abnormal 07/27/2014  . 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 Chronic kidney disease (CKD), stage III (moderate) ([place]) 07/27/2014  . Edema 07/27/2014  . Vitamin D deficiency 07/27/2014  . Neuropathy 07/27/2014  . 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 Type 2 diabetes mellitus ([place]) 02/01/2014  . Shortness of breath 06/27/2013  . Personal history of colonic polyps-adenomas 12/03/2011  . Angiodysplasia of cecum  12/03/2011    Past Surgical History:  Procedure Laterality Date  . ABDOMINAL HYSTERECTOMY  1981  . CATARACT EXTRACTION Bilateral 2003  . CESAREAN SECTION  1981  . CHOLECYSTECTOMY  1982  . RETINOPATHY SURGERY Right     Her family history includes Cancer in her mother; Coronary artery disease in her father; Heart attack in her father and paternal grandfather; Heart disease in her father. There is no history of Colon cancer, Rectal cancer, Stomach cancer, or Esophageal cancer.      Current Outpatient Medications:  .  aspirin 81 MG tablet, Take 81 mg by mouth daily., Disp: , Rfl:  .   atenolol (TENORMIN) 25 MG tablet, Take 1 tablet (25 mg total) by mouth daily., Disp: 90 tablet, Rfl: 4 .  Cholecalciferol (VITAMIN D-3) 5000 UNITS TABS, Take 1 tablet by mouth every 7 (seven) days., Disp: , Rfl:  .  clobetasol cream (TEMOVATE) 1.61 %, Apply 1 application topically 2 (two) times daily. PRN, Disp: , Rfl:  .  hydrochlorothiazide (HYDRODIURIL) 25 MG tablet, TAKE 1 TABLET BY MOUTH ONCE A DAY, Disp: 90 tablet, Rfl: 4 .  ONE TOUCH ULTRA TEST test strip, CHECK BLOOD SUGAR TWICE A DAY, Disp: 200 each, Rfl: 4 .  Cinnamon 500 MG TABS, Take 1 tablet by mouth 2 (two) times daily., Disp: , Rfl:    Patient Care Team: Birdie Sons, MD as PCP - General (Family Medicine) Teodoro Spray, MD as Consulting Physician (Cardiology) Gatha Mayer, MD as Consulting Physician (Gastroenterology)     Objective:   Vitals: BP (!) 160/70 (BP Location: Right Arm, Patient Position: Sitting, Cuff Size: Normal)   Pulse (!) 48   Temp 98.2 F (36.8 C) (Oral)   Ht 5\' 4"  (1.626 m)   Wt 166 lb (75.3 kg)   SpO2 96%   BMI 28.49 kg/m   Physical Exam   General Appearance:    Alert, cooperative, no distress, appears stated age  Head:    Normocephalic, without obvious abnormality, atraumatic  Eyes:    PERRL, conjunctiva/corneas clear, EOM's intact, fundi    benign, both eyes  Ears:    Normal TM's and external ear canals, both ears  Nose:   Nares normal, septum midline, mucosa normal, no drainage    or sinus tenderness  Throat:   Lips, mucosa, and tongue normal; teeth and gums normal  Neck:   Supple, symmetrical, trachea midline, no adenopathy;    thyroid:  no enlargement/tenderness/nodules; no carotid   bruit or JVD  Back:     Symmetric, no curvature, ROM normal, no CVA tenderness  Lungs:     Clear to auscultation bilaterally, respirations unlabored  Chest Wall:    No tenderness or deformity   Heart:    Regular rate and rhythm, S1 and S2 normal, no murmur, rub   or gallop  Breast Exam:     deferred  Abdomen:     Soft, non-tender, bowel sounds active all four quadrants,    no masses, no organomegaly  Pelvic:    deferred  Extremities:   Extremities normal, atraumatic, no cyanosis or edema  Pulses:   2+ and symmetric all extremities  Skin:   Skin color, texture, turgor normal, no rashes or lesions  Lymph nodes:   Cervical, supraclavicular, and axillary nodes normal  Neurologic:   CNII-XII intact, normal strength, sensation and reflexes    throughout     Activities of Daily Living In your present state of health, do you have any  difficulty performing the following activities: 03/08/2018  Hearing? N  Vision? N  Comment Wears eye glasses.   Difficulty concentrating or making decisions? N  Walking or climbing stairs? Y  Comment Due to knee pains and neuropathy.  Dressing or bathing? N  Doing errands, shopping? N  Preparing Food and eating ? N  Using the Toilet? N  In the past six months, have you accidently leaked urine? N  Do you have problems with loss of bowel control? N  Managing your Medications? N  Managing your Finances? N  Housekeeping or managing your Housekeeping? N  Some recent data might be hidden    Fall Risk Assessment Fall Risk  04/20/2018 03/08/2018 03/03/2017 02/09/2017 02/05/2016  Falls in the past year? 0 0 No No No  Number falls in past yr: - 0 - - -  Injury with Fall? - 0 - - -     Depression Screen PHQ 2/9 Scores 04/20/2018 03/08/2018 03/03/2017 02/09/2017  PHQ - 2 Score 0 0 0 0  PHQ- 9 Score - - 1 0    No flowsheet data found.    Assessment & Plan:    Annual Physical Reviewed patient's Family Medical History Reviewed and updated list of patient's medical providers Assessment of cognitive impairment was done Assessed patient's functional ability Established a written schedule for health screening [place] Completed and Reviewed  Exercise Activities and Dietary recommendations Goals    . DIET - INCREASE  WATER INTAKE     Recommend increasing water intake to 4 glasses a day.     . Exercise 150 minutes per week (moderate activity)       Immunization History  Administered Date(s) Administered  . Influenza, High Dose Seasonal PF 02/05/2016, 02/09/2017, 03/08/2018  . Influenza,inj,Quad PF,6+ Mos 01/31/2015  . Pneumococcal Conjugate-13 01/31/2015  . Pneumococcal Polysaccharide-23 11/04/2011  . Tdap 11/04/2011    Health Maintenance  Topic Date Due  . COLONOSCOPY  12/03/2014  . OPHTHALMOLOGY EXAM  04/25/2016  . HEMOGLOBIN A1C  08/09/2017  . FOOT EXAM  02/09/2018  . URINE MICROALBUMIN  02/09/2018  . DEXA SCAN  12/01/2018  . TETANUS/TDAP  11/03/2021  . INFLUENZA VACCINE  Completed  . PNA vac Low Risk Adult  Completed     Discussed health benefits of physical activity, and encouraged her to engage in regular exercise appropriate for her age and condition.    ------------------------------------------------------------------------------------------------------------  1. Annual physical exam   2. Type 2 diabetes mellitus without complication, without long-term current use of insulin (HCC) Results for orders placed or performed in visit on 04/20/18  POCT glycosylated hemoglobin (Hb A1C)  Result Value Ref Range   Hemoglobin A1C 6.5 (A) 4.0 - 5.6 %    3. Hyperlipidemia, unspecified hyperlipidemia type  - Comprehensive metabolic panel - Lipid panel  4. Benign hypertension with chronic kidney disease, stage III (HCC) SBP unusually elevated today. Is usually managed by Dr. Ubaldo Glassing who she sees tomorrow.   5. Chronic kidney disease (CKD), stage III (moderate) (HCC) Check labs as above.   6. Vitamin D deficiency  - VITAMIN D 25 Hydroxy (Vit-D Deficiency, Fractures)  7. Personal history of colonic polyps-adenomas She is overdue for follow up colonoscopy with Dr. Carlean Purl and given contact information to schedule  8. Estrogen deficiency  - DG Bone Density; Future  9. Breast  cancer screening  - MM Digital Screening; Future    Lelon Huh, MD  [facility]

## 2018-04-21 DIAGNOSIS — I10 Essential (primary) hypertension: Secondary | ICD-10-CM | POA: Diagnosis not present

## 2018-04-21 DIAGNOSIS — E559 Vitamin D deficiency, unspecified: Secondary | ICD-10-CM | POA: Diagnosis not present

## 2018-04-21 DIAGNOSIS — E785 Hyperlipidemia, unspecified: Secondary | ICD-10-CM | POA: Diagnosis not present

## 2018-04-21 DIAGNOSIS — E119 Type 2 diabetes mellitus without complications: Secondary | ICD-10-CM | POA: Diagnosis not present

## 2018-04-21 DIAGNOSIS — R001 Bradycardia, unspecified: Secondary | ICD-10-CM | POA: Diagnosis not present

## 2018-04-22 ENCOUNTER — Telehealth: Payer: Self-pay

## 2018-04-22 LAB — LIPID PANEL
Chol/HDL Ratio: 4.1 ratio (ref 0.0–4.4)
Cholesterol, Total: 205 mg/dL — ABNORMAL HIGH (ref 100–199)
HDL: 50 mg/dL (ref >39)
LDL Calculated: 125 mg/dL — ABNORMAL HIGH (ref 0–99)
Triglycerides: 152 mg/dL — ABNORMAL HIGH (ref 0–149)
VLDL Cholesterol Cal: 30 mg/dL (ref 5–40)

## 2018-04-22 LAB — COMPREHENSIVE METABOLIC PANEL
A/G RATIO: 1.5 (ref 1.2–2.2)
ALT: 14 IU/L (ref 0–32)
AST: 14 IU/L (ref 0–40)
Albumin: 4.1 g/dL (ref 3.7–4.7)
Alkaline Phosphatase: 45 IU/L (ref 39–117)
BILIRUBIN TOTAL: 0.4 mg/dL (ref 0.0–1.2)
BUN / CREAT RATIO: 12 (ref 12–28)
BUN: 14 mg/dL (ref 8–27)
CHLORIDE: 98 mmol/L (ref 96–106)
CO2: 30 mmol/L — ABNORMAL HIGH (ref 20–29)
Calcium: 9.7 mg/dL (ref 8.7–10.3)
Creatinine, Ser: 1.14 mg/dL — ABNORMAL HIGH (ref 0.57–1.00)
GFR calc non Af Amer: 46 mL/min/{1.73_m2} — ABNORMAL LOW (ref >59)
GFR, EST AFRICAN AMERICAN: 53 mL/min/{1.73_m2} — AB (ref >59)
Globulin, Total: 2.8 g/dL (ref 1.5–4.5)
Glucose: 146 mg/dL — ABNORMAL HIGH (ref 65–99)
POTASSIUM: 3.6 mmol/L (ref 3.5–5.2)
Sodium: 141 mmol/L (ref 134–144)
TOTAL PROTEIN: 6.9 g/dL (ref 6.0–8.5)

## 2018-04-22 LAB — VITAMIN D 25 HYDROXY (VIT D DEFICIENCY, FRACTURES): Vit D, 25-Hydroxy: 27.5 ng/mL — ABNORMAL LOW (ref 30.0–100.0)

## 2018-04-22 NOTE — Telephone Encounter (Signed)
Patient advised of results. Patient says she really doesn't want to start another medication because she afraid to try new medications. She says her husband takes Fish oil. She wants to know if it's ok for her to take something like that or another natural/ OTC supplement to help lower her cholesterol? Follow up appointment scheduled 10/21/2018 at 9:40am.

## 2018-04-22 NOTE — Telephone Encounter (Signed)
-----   Message from Birdie Sons, MD sent at 04/22/2018  8:14 AM EST ----- kidney functions a little worse. Need to drink more water. Vitamin d level a little bit low. Be sure to take vitamin d supplement consistently.  Cholesterol is getting higher. Recommend ezetimibe 10mg  daily (this is not a statin and doesn't have side effects like statins) #30, rf x 6.  Follow up 6 months to check a1c and lipids.

## 2018-04-22 NOTE — Telephone Encounter (Signed)
Fish oil lowers triglycerides, but raises LDL cholesterol. She could try Red Yeast Rice extract, but red yeast extract contains lovastatin and may have more side effects the ezetimibe. She can try it if she likes, but the ezetimibe would work better.

## 2018-04-22 NOTE — Telephone Encounter (Signed)
LMTCB 04/22/2018  Thanks,   -Mickel Baas

## 2018-04-22 NOTE — Telephone Encounter (Signed)
Pt advised she is going to try Red Yeast Rice and will call back if she wants to start ezetimibe.   Thanks,   -Mickel Baas

## 2018-05-18 ENCOUNTER — Other Ambulatory Visit: Payer: Self-pay | Admitting: Family Medicine

## 2018-05-18 DIAGNOSIS — E119 Type 2 diabetes mellitus without complications: Secondary | ICD-10-CM

## 2018-06-13 ENCOUNTER — Telehealth: Payer: Self-pay

## 2018-06-14 ENCOUNTER — Other Ambulatory Visit: Payer: Commercial Managed Care - HMO

## 2018-10-19 DIAGNOSIS — E119 Type 2 diabetes mellitus without complications: Secondary | ICD-10-CM | POA: Diagnosis not present

## 2018-10-19 DIAGNOSIS — R001 Bradycardia, unspecified: Secondary | ICD-10-CM | POA: Diagnosis not present

## 2018-10-19 DIAGNOSIS — I10 Essential (primary) hypertension: Secondary | ICD-10-CM | POA: Diagnosis not present

## 2018-10-21 ENCOUNTER — Ambulatory Visit: Payer: Self-pay | Admitting: Family Medicine

## 2019-03-09 NOTE — Progress Notes (Signed)
Subjective:   Tracie Gray is a 78 y.o. female who presents for Medicare Annual (Subsequent) preventive examination.    This visit is being conducted through telemedicine due to the COVID-19 pandemic. This patient has given me verbal consent via doximity to conduct this visit, patient states they are participating from their home address. Some vital signs may be absent or patient reported.    Patient identification: identified by name, DOB, and current address  Review of Systems:  N/A  Cardiac Risk Factors include: advanced age (>58men, >39 women);diabetes mellitus;dyslipidemia     Objective:     Vitals: There were no vitals taken for this visit.  There is no height or weight on file to calculate BMI. Unable to obtain vitals due to visit being conducted via telephonically.   Advanced Directives 03/13/2019 03/08/2018 03/03/2017 01/31/2015  Does Patient Have a Medical Advance Directive? Yes Yes Yes Yes  Type of Paramedic of [place];Living will [place];Living will Living will;Healthcare Power of [place];Living will  Copy of [place] in Chart? No - copy requested No - copy requested No - copy requested -    Tobacco Social History   Tobacco Use  Smoking Status Former Smoker  . Packs/day: 1.00  . Years: 1.00  . Pack years: 1.00  . Types: Cigarettes  . Quit date: 03/23/1962  . Years since quitting: 57.0  Smokeless Tobacco Never Used     Counseling given: Not Answered   Clinical Intake:  Pre-visit preparation completed: Yes  Pain : No/denies pain Pain Score: 0-No pain     Nutritional Risks: None Diabetes: Yes  How often do you need to have someone help you when you read instructions, pamphlets, or other written materials from your doctor or pharmacy?: 1 - Never   Diabetes:  Is the patient diabetic?  Yes type 2 If diabetic, was a CBG obtained today?  No  Did  the patient bring in their glucometer from home?  No  How often do you monitor your CBG's? Once a day.   Financial Strains and Diabetes Management:  Are you having any financial strains with the device, your supplies or your medication? No .  Does the patient want to be seen by Chronic Care Management for management of their diabetes?  No  Would the patient like to be referred to a Nutritionist or for Diabetic Management?  No   Diabetic Exams:  Diabetic Eye Exam: Completed in 2019 per pt. Overdue for diabetic eye exam. Pt has been advised about the importance in completing this exam.   Diabetic Foot Exam: Completed 02/09/17. Pt has been advised about the importance in completing this exam. Note made to follow up on this at next in office apt.     Interpreter Needed?: No  Information entered by :: [facility], LPN  Past Medical History:  Diagnosis Date  . Arthritic-like pain   . Bilateral cataracts   . 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 Hypertension   . Personal history of colonic polyps-adenomas 12/03/2011  . Thyroid disease    Past Surgical History:  Procedure Laterality Date  . ABDOMINAL HYSTERECTOMY  1981  . CATARACT EXTRACTION Bilateral 2003  . CESAREAN SECTION  1981  . CHOLECYSTECTOMY  1982  . RETINOPATHY SURGERY Right    Family History  Problem Relation Age of Onset  . Cancer Mother   . Coronary artery disease Father   .  Heart disease Father   . Heart attack Father   . Heart attack Paternal Grandfather   . Colon cancer Neg Hx   . Rectal cancer Neg Hx   . Stomach cancer Neg Hx   . Esophageal cancer Neg Hx    Social History   Socioeconomic History  . Marital status: Married    Spouse name: Fritz Pickerel  . Number of children: 2  . Years of education: Not on file  . Highest education level: Some college, no degree  Occupational History  . Occupation: retired  Tobacco Use  . Smoking status: Former Smoker    Packs/day: 1.00     Years: 1.00    Pack years: 1.00    Types: Cigarettes    Quit date: 03/23/1962    Years since quitting: 57.0  . Smokeless tobacco: Never Used  Substance and Sexual Activity  . Alcohol use: No  . Drug use: No  . Sexual activity: Not on file  Other Topics Concern  . Not on file  Social History Narrative   2 biological and 1 adopted    Social Determinants of Health   Financial Resource Strain: Low Risk   . Difficulty of Paying Living Expenses: Not hard at all  Food Insecurity: No Food Insecurity  . Worried About Charity fundraiser in the Last Year: Never true  . Ran Out of Food in the Last Year: Never true  Transportation Needs: No Transportation Needs  . Lack of Transportation (Medical): No  . Lack of Transportation (Non-Medical): No  Physical Activity: Inactive  . Days of Exercise per Week: 0 days  . Minutes of Exercise per Session: 0 min  Stress: Stress Concern Present  . Feeling of Stress : Rather much  Social Connections: Slightly Isolated  . Frequency of Communication with Friends and Family: Three times a week  . Frequency of Social Gatherings with Friends and Family: Once a week  . Attends Religious Services: More than 4 times per year  . Active Member of Clubs or Organizations: No  . Attends Archivist Meetings: Never  . Marital Status: Married    Outpatient Encounter Medications as of 03/13/2019  Medication Sig  . aspirin 81 MG tablet Take 81 mg by mouth daily.  Marland Kitchen atenolol (TENORMIN) 25 MG tablet Take 1 tablet (25 mg total) by mouth daily.  . Cholecalciferol (VITAMIN D-3) 5000 UNITS TABS Take 1 tablet by mouth every 7 (seven) days.  . clobetasol cream (TEMOVATE) [id number] % Apply 1 application topically 2 (two) times daily. PRN  . hydrochlorothiazide (HYDRODIURIL) 25 MG tablet TAKE 1 TABLET BY MOUTH ONCE A DAY  . ONE TOUCH ULTRA TEST test strip USE AS DIRECTED TO CHECK BLOOD SUGAR TWICE DAILY   No facility-administered encounter medications on file as of  03/13/2019.    Activities of Daily Living In your present state of health, do you have any difficulty performing the following activities: 03/13/2019  Hearing? N  Vision? N  Difficulty concentrating or making decisions? N  Walking or climbing stairs? Y  Comment Due to SOB.  Dressing or bathing? N  Doing errands, shopping? N  Preparing Food and eating ? N  Using the Toilet? N  In the past six months, have you accidently leaked urine? N  Do you have problems with loss of bowel control? N  Managing your Medications? N  Managing your Finances? N  Housekeeping or managing your Housekeeping? N  Some recent data might be hidden    Patient  Care Team: Birdie Sons, MD as PCP - General (Family Medicine) Ubaldo Glassing Javier Docker, MD as Consulting Physician (Cardiology) Gatha Mayer, MD as Consulting Physician (Gastroenterology)    Assessment:   This is a routine wellness examination for [place].  Exercise Activities and Dietary recommendations Current Exercise Habits: The patient does not participate in regular exercise at present, Exercise limited by: None identified  Goals    . DIET - INCREASE WATER INTAKE     Recommend increasing water intake to 4 glasses a day.     . Exercise 150 minutes per week (moderate activity)       Fall Risk: Fall Risk  03/13/2019 04/20/2018 03/08/2018 03/03/2017 02/09/2017  Falls in the past year? 0 0 0 No No  Number falls in past yr: 0 - 0 - -  Injury with Fall? 0 - 0 - -    FALL RISK PREVENTION PERTAINING TO THE HOME:  Any stairs in or around the home? Yes  If so, are there any without handrails? No   Home free of loose throw rugs in walkways, pet beds, electrical cords, etc? Yes  Adequate lighting in your home to reduce risk of falls? Yes   ASSISTIVE DEVICES UTILIZED TO PREVENT FALLS:  Life alert? No  Use of a cane, walker or w/c? Yes  Grab bars in the bathroom? Yes  Shower chair or bench in shower? No  Elevated toilet seat or a  handicapped toilet? Yes   TIMED UP AND GO:  Was the test performed? No .    Depression Screen PHQ 2/9 Scores 03/13/2019 03/13/2019 04/20/2018 03/08/2018  PHQ - 2 Score 0 0 0 0  PHQ- 9 Score - - - -     Cognitive Function: Declined today.         Immunization History  Administered Date(s) Administered  . Influenza, High Dose Seasonal PF 02/05/2016, 02/09/2017, 03/08/2018  . Influenza,inj,Quad PF,6+ Mos 01/31/2015  . Pneumococcal Conjugate-13 01/31/2015  . Pneumococcal Polysaccharide-23 11/04/2011  . Tdap 11/04/2011  . Zoster Recombinat (Shingrix) 04/21/2018, 01/24/2019    Qualifies for Shingles Vaccine? Completed series  Tdap: Up to date  Flu Vaccine: Due for Flu vaccine. Does the patient want to receive this vaccine today?  No . Advised may receive this vaccine at local pharmacy or Health Dept. Aware to provide a copy of the vaccination record if obtained from local pharmacy or Health Dept. Verbalized acceptance and understanding.  Pneumococcal Vaccine: Completed series  Screening Tests Health Maintenance  Topic Date Due  . COLONOSCOPY  12/03/2014  . OPHTHALMOLOGY EXAM  04/25/2016  . FOOT EXAM  02/09/2018  . URINE MICROALBUMIN  02/09/2018  . HEMOGLOBIN A1C  10/19/2018  . INFLUENZA VACCINE  10/22/2018  . TETANUS/TDAP  11/03/2021  . DEXA SCAN  Completed  . PNA vac Low Risk Adult  Completed    Cancer Screenings:  Colorectal Screening: Completed 12/03/11. Repeat every 3 years.  Mammogram: No longer required.   Bone Density: Completed 11/30/13. Results reflect NORMAL. No repeat needed unless advised by a physician.  Lung Cancer Screening: (Low Dose CT Chest recommended if Age 56-80 years, 30 pack-year currently smoking OR have quit w/in 15years.) does not qualify.   Additional Screening:  Dental Screening: Recommended annual dental exams for proper oral hygiene   Community Resource Referral:  CRR required this visit?  No       Plan:  I have personally  reviewed and addressed the Medicare Annual Wellness questionnaire and have noted the following in  the patient's chart:  A. Medical and social history B. Use of alcohol, tobacco or illicit drugs  C. Current medications and supplements D. Functional ability and status E.  Nutritional status F.  Physical activity G. Advance directives H. List of other physicians I.  Hospitalizations, surgeries, and ER visits in previous 12 months J.  [place] such as hearing and vision if needed, cognitive and depression L. Referrals and appointments   In addition, I have reviewed and discussed with patient certain preventive protocols, quality metrics, and best practice recommendations. A written personalized care plan for preventive services as well as general preventive health recommendations were provided to patient.   [place]ora Score, [place]  [id number] Nurse Health Advisor   Nurse Notes: Pt declined a colonoscopy referral today or scheduling a future eye exam. Pt needs a diabetic foot exam, Hgb A1c check and urine check at next in office apt. Pt plans to receive a flu shot at the pharmacy.

## 2019-03-13 ENCOUNTER — Ambulatory Visit (INDEPENDENT_AMBULATORY_CARE_PROVIDER_SITE_OTHER): Payer: PPO

## 2019-03-13 ENCOUNTER — Other Ambulatory Visit: Payer: Self-pay

## 2019-03-13 DIAGNOSIS — Z0000 Encounter for general adult medical examination without abnormal findings: Secondary | ICD-10-CM | POA: Diagnosis not present

## 2019-03-13 NOTE — Patient Instructions (Signed)
Ms. Tracie Gray , Thank you for taking time to come for your Medicare Wellness Visit. I appreciate your ongoing commitment to your health goals. Please review the following plan we discussed and let me know if I can assist you in the future.   Screening recommendations/referrals: Colonoscopy: Currently due. Declined a colonoscopy referral and a cologuard order today.  Mammogram: No longer required.  Bone Density: Up to date, previous DEXA was normal. No repeat needed unless advised by a physician.  Recommended yearly ophthalmology/optometry visit for glaucoma screening and checkup Recommended yearly dental visit for hygiene and checkup  Vaccinations: Influenza vaccine: Currently due. Pt to receive at the pharmacy.  Pneumococcal vaccine: Completed series Tdap vaccine: Up to date, due 10/2021 Shingles vaccine: Completed series    Advanced directives: Please bring a copy of your POA (Power of Attorney) and/or Living Will to your next appointment.   Conditions/risks identified: Recommend increasing water intake to 6-8 8 oz glasses a day.   Next appointment: Declined scheduling a follow up or AWV for 2021 at this time.    Preventive Care 39 Years and Older, Female Preventive care refers to lifestyle choices and visits with your health care provider that can promote health and wellness. What does preventive care include?  A yearly physical exam. This is also called an annual well check.  Dental exams once or twice a year.  Routine eye exams. Ask your health care provider how often you should have your eyes checked.  Personal lifestyle choices, including:  Daily care of your teeth and gums.  Regular physical activity.  Eating a healthy diet.  Avoiding tobacco and drug use.  Limiting alcohol use.  Practicing safe sex.  Taking low-dose aspirin every day.  Taking vitamin and mineral supplements as recommended by your health care provider. What happens during an annual well check?  The services and screenings done by your health care provider during your annual well check will depend on your age, overall health, lifestyle risk factors, and family history of disease. Counseling  Your health care provider may ask you questions about your:  Alcohol use.  Tobacco use.  Drug use.  Emotional well-being.  Home and relationship well-being.  Sexual activity.  Eating habits.  History of falls.  Memory and ability to understand (cognition).  Work and work Statistician.  Reproductive health. Screening  You may have the following tests or measurements:  Height, weight, and BMI.  Blood pressure.  Lipid and cholesterol levels. These may be checked every 5 years, or more frequently if you are over 46 years old.  Skin check.  Lung cancer screening. You may have this screening every year starting at age 67 if you have a 30-pack-year history of smoking and currently smoke or have quit within the past 15 years.  Fecal occult blood test (FOBT) of the stool. You may have this test every year starting at age 63.  Flexible sigmoidoscopy or colonoscopy. You may have a sigmoidoscopy every 5 years or a colonoscopy every 10 years starting at age 9.  Hepatitis C blood test.  Hepatitis B blood test.  Sexually transmitted disease (STD) testing.  Diabetes screening. This is done by checking your blood sugar (glucose) after you have not eaten for a while (fasting). You may have this done every 1-3 years.  Bone density scan. This is done to screen for osteoporosis. You may have this done starting at age 19.  Mammogram. This may be done every 1-2 years. Talk to your health care provider  about how often you should have regular mammograms. Talk with your health care provider about your test results, treatment options, and if necessary, the need for more tests. Vaccines  Your health care provider may recommend certain vaccines, such as:  Influenza vaccine. This is  recommended every year.  Tetanus, diphtheria, and acellular pertussis (Tdap, Td) vaccine. You may need a Td booster every 10 years.  Zoster vaccine. You may need this after age 66.  Pneumococcal 13-valent conjugate (PCV13) vaccine. One dose is recommended after age 6.  Pneumococcal polysaccharide (PPSV23) vaccine. One dose is recommended after age 69. Talk to your health care provider about which screenings and vaccines you need and how often you need them. This information is not intended to replace advice given to you by your health care provider. Make sure you discuss any questions you have with your health care provider. Document Released: 04/05/2015 Document Revised: 11/27/2015 Document Reviewed: 01/08/2015 Elsevier Interactive Patient Education  2017 [place] Prevention in the Home Falls can cause injuries. They can happen to people of all ages. There are many things you can do to make your home safe and to help prevent falls. What can I do on the outside of my home?  Regularly fix the edges of walkways and driveways and fix any cracks.  Remove anything that might make you trip as you walk through a door, such as a raised step or threshold.  Trim any bushes or trees on the path to your home.  Use bright outdoor lighting.  Clear any walking paths of anything that might make someone trip, such as rocks or tools.  Regularly check to see if handrails are loose or broken. Make sure that both sides of any steps have handrails.  Any raised decks and porches should have guardrails on the edges.  Have any leaves, snow, or ice cleared regularly.  Use sand or salt on walking paths during winter.  Clean up any spills in your garage right away. This includes oil or grease spills. What can I do in the bathroom?  Use night lights.  Install grab bars by the toilet and in the tub and shower. Do not use towel bars as grab bars.  Use non-skid mats or decals in the tub or  shower.  If you need to sit down in the shower, use a plastic, non-slip stool.  Keep the floor dry. Clean up any water that spills on the floor as soon as it happens.  Remove soap buildup in the tub or shower regularly.  Attach bath mats securely with double-sided non-slip rug tape.  Do not have throw rugs and other things on the floor that can make you trip. What can I do in the bedroom?  Use night lights.  Make sure that you have a light by your bed that is easy to reach.  Do not use any sheets or blankets that are too big for your bed. They should not hang down onto the floor.  Have a firm chair that has side arms. You can use this for support while you get dressed.  Do not have throw rugs and other things on the floor that can make you trip. What can I do in the kitchen?  Clean up any spills right away.  Avoid walking on wet floors.  Keep items that you use a lot in easy-to-reach places.  If you need to reach something above you, use a strong step stool that has a grab bar.  Keep electrical cords out of the way.  Do not use floor polish or wax that makes floors slippery. If you must use wax, use non-skid floor wax.  Do not have throw rugs and other things on the floor that can make you trip. What can I do with my stairs?  Do not leave any items on the stairs.  Make sure that there are handrails on both sides of the stairs and use them. Fix handrails that are broken or loose. Make sure that handrails are as long as the stairways.  Check any carpeting to make sure that it is firmly attached to the stairs. Fix any carpet that is loose or worn.  Avoid having throw rugs at the top or bottom of the stairs. If you do have throw rugs, attach them to the floor with carpet tape.  Make sure that you have a light switch at the top of the stairs and the bottom of the stairs. If you do not have them, ask someone to add them for you. What else can I do to help prevent falls?   Wear shoes that:  Do not have high heels.  Have rubber bottoms.  Are comfortable and fit you well.  Are closed at the toe. Do not wear sandals.  If you use a stepladder:  Make sure that it is fully opened. Do not climb a closed stepladder.  Make sure that both sides of the stepladder are locked into place.  Ask someone to hold it for you, if possible.  Clearly mark and make sure that you can see:  Any grab bars or handrails.  First and last steps.  Where the edge of each step is.  Use tools that help you move around (mobility aids) if they are needed. These include:  Canes.  Walkers.  Scooters.  Crutches.  Turn on the lights when you go into a dark area. Replace any light bulbs as soon as they burn out.  Set up your furniture so you have a clear path. Avoid moving your furniture around.  If any of your floors are uneven, fix them.  If there are any pets around you, be aware of where they are.  Review your medicines with your doctor. Some medicines can make you feel dizzy. This can increase your chance of falling. Ask your doctor what other things that you can do to help prevent falls. This information is not intended to replace advice given to you by your health care provider. Make sure you discuss any questions you have with your health care provider. Document Released: 01/03/2009 Document Revised: 08/15/2015 Document Reviewed: 04/13/2014 Elsevier Interactive Patient Education  2017 [employer].

## 2019-04-24 ENCOUNTER — Other Ambulatory Visit: Payer: Self-pay | Admitting: Family Medicine

## 2019-04-24 DIAGNOSIS — I10 Essential (primary) hypertension: Secondary | ICD-10-CM

## 2019-04-24 DIAGNOSIS — E119 Type 2 diabetes mellitus without complications: Secondary | ICD-10-CM | POA: Diagnosis not present

## 2019-04-24 DIAGNOSIS — R001 Bradycardia, unspecified: Secondary | ICD-10-CM | POA: Diagnosis not present

## 2019-04-24 NOTE — Telephone Encounter (Signed)
Requested medication (s) are due for refill today: yes  Requested medication (s) are on the active medication list: yes  Last refill:  01/21/18  Future visit scheduled: no  Notes to clinic:  overdue for appt and Labs please review   Requested Prescriptions  Pending Prescriptions Disp Refills   hydrochlorothiazide (HYDRODIURIL) 25 MG tablet [Pharmacy Med Name: HYDROCHLOROTHIAZIDE 25 MG TAB] 90 tablet 4    Sig: TAKE 1 TABLET BY MOUTH ONCE DAILY      Cardiovascular: Diuretics - Thiazide Failed - 04/24/2019  9:28 AM      Failed - Ca in normal range and within 360 days    Calcium  Date Value Ref Range Status  04/21/2018 9.7 8.7 - 10.3 mg/dL Final          Failed - Cr in normal range and within 360 days    Creatinine, Ser  Date Value Ref Range Status  04/21/2018 1.14 (H) 0.57 - 1.00 mg/dL Final          Failed - K in normal range and within 360 days    Potassium  Date Value Ref Range Status  04/21/2018 3.6 3.5 - 5.2 mmol/L Final          Failed - Na in normal range and within 360 days    Sodium  Date Value Ref Range Status  04/21/2018 141 134 - 144 mmol/L Final          Failed - Last BP in normal range    BP Readings from Last 1 Encounters:  04/20/18 (!) 142/72          Passed - Valid encounter within last 6 months    Recent Outpatient Visits           1 year ago Annual physical exam   [facility] Birdie Sons, MD   2 years ago Upper respiratory tract infection, unspecified type   [facility] Birdie Sons, MD   2 years ago Type 2 diabetes mellitus without complication, without long-term current use of insulin [facility])   [facility] Birdie Sons, MD   2 years ago Type 2 diabetes mellitus without complication, without long-term current use of insulin [facility])   [facility] Birdie Sons, MD   3 years ago Annual physical exam   [facility] Birdie Sons, MD

## 2019-07-10 ENCOUNTER — Other Ambulatory Visit: Payer: Self-pay | Admitting: Family Medicine

## 2019-07-10 DIAGNOSIS — E119 Type 2 diabetes mellitus without complications: Secondary | ICD-10-CM

## 2019-07-19 ENCOUNTER — Other Ambulatory Visit: Payer: Self-pay | Admitting: Family Medicine

## 2019-07-19 DIAGNOSIS — I10 Essential (primary) hypertension: Secondary | ICD-10-CM

## 2019-07-19 NOTE — Telephone Encounter (Signed)
Refill request for HCTZ; last visit 1/29/220; no upcoming visits noted; last refill 04/24/19 with RX note that pt needs visit for additional refills;pt notified; she would like a virtual appt; pt offered and accepted virtual appt with Dr Lelon Huh, [facility], 07/21/19 at 0800; she verbalized understanding; will approve 30 day courtesy refill; will also route to office for notification. Requested Prescriptions  Pending Prescriptions Disp Refills  . hydrochlorothiazide (HYDRODIURIL) 25 MG tablet [Pharmacy Med Name: HYDROCHLOROTHIAZIDE 25 MG TAB] 90 tablet 0    Sig: TAKE 1 TABLET BY MOUTH ONCE DAILY *NEED OFFICE VISIT FOR MORE REFILLS*     Cardiovascular: Diuretics - Thiazide Failed - 07/19/2019  9:09 AM      Failed - Ca in normal range and within 360 days    Calcium  Date Value Ref Range Status  04/21/2018 9.7 8.7 - 10.3 mg/dL Final         Failed - Cr in normal range and within 360 days    Creatinine, Ser  Date Value Ref Range Status  04/21/2018 1.14 (H) 0.57 - 1.00 mg/dL Final         Failed - K in normal range and within 360 days    Potassium  Date Value Ref Range Status  04/21/2018 3.6 3.5 - 5.2 mmol/L Final         Failed - Na in normal range and within 360 days    Sodium  Date Value Ref Range Status  04/21/2018 141 134 - 144 mmol/L Final         Failed - Last BP in normal range    BP Readings from Last 1 Encounters:  04/20/18 (!) 142/72         Passed - Valid encounter within last 6 months    Recent Outpatient Visits          1 year ago Annual physical exam   [facility] Birdie Sons, MD   2 years ago Upper respiratory tract infection, unspecified type   [facility] Birdie Sons, MD   2 years ago Type 2 diabetes mellitus without complication, without long-term current use of insulin [facility])   [facility] Birdie Sons, MD   2 years ago Type 2 diabetes mellitus without complication, without long-term  current use of insulin [facility])   [facility] Birdie Sons, MD   3 years ago Annual physical exam   [facility] Birdie Sons, MD

## 2019-07-21 ENCOUNTER — Telehealth: Payer: Self-pay | Admitting: Family Medicine

## 2019-08-14 NOTE — Progress Notes (Signed)
I,Roshena L Chambers,acting as a scribe for Lelon Huh, MD.,have documented all relevant documentation on the behalf of Lelon Huh, MD,as directed by  Lelon Huh, MD while in the presence of Lelon Huh, MD.  Established patient visit   Patient: Tracie Gray   DOB: 1940-07-26   79 y.o. Female  MRN: [id number] Visit Date: 08/15/2019  Today's healthcare provider: Lelon Huh, MD   Chief Complaint  Patient presents with  . Diabetes  . Hypertension  . Hyperlipidemia   Subjective    HPI Diabetes Mellitus Type II, Follow-up  Lab Results  Component Value Date   HGBA1C 6.5 (A) 04/20/2018   HGBA1C 6.2 02/09/2017   HGBA1C 6.3 08/04/2016   Wt Readings from Last 3 Encounters:  08/15/19 167 lb (75.8 kg)  04/20/18 166 lb (75.3 kg)  03/08/18 166 lb 3.2 oz (75.4 kg)   Last seen for diabetes 1 years ago.  Management since then includes no changes. She reports good compliance with treatment. She is not having side effects.  Symptoms: No fatigue No foot ulcerations  No appetite changes No nausea  No paresthesia of the feet  No polydipsia  No polyuria No visual disturbances   No vomiting     Home blood sugar records: fasting range: 130-170  Episodes of hypoglycemia? No    Current insulin regiment: none Most Recent Eye Exam: not UTD Current exercise: none Current diet habits: in general, a "healthy" diet    Pertinent Labs: Lab Results  Component Value Date   CHOL 205 (H) 04/21/2018   HDL 50 04/21/2018   LDLCALC 125 (H) 04/21/2018   TRIG 152 (H) 04/21/2018   CHOLHDL 4.1 04/21/2018   Lab Results  Component Value Date   NA 141 04/21/2018   K 3.6 04/21/2018   CREATININE 1.14 (H) 04/21/2018   GFRNONAA 46 (L) 04/21/2018   GFRAA 53 (L) 04/21/2018   GLUCOSE 146 (H) 04/21/2018     --------------------------------------------------------------------------------------------------- Hypertension, follow-up  BP Readings from Last 3 Encounters:  08/15/19  (!) 178/62  04/20/18 (!) 142/72  03/08/18 140/64   Wt Readings from Last 3 Encounters:  08/15/19 167 lb (75.8 kg)  04/20/18 166 lb (75.3 kg)  03/08/18 166 lb 3.2 oz (75.4 kg)     She was last seen for hypertension 1 years ago.  BP at that visit was 142/72. Management since that visit includes advising patient to drink more water due to worsening kidney functions. Hypertension is managed by [facility].  She reports good compliance with treatment. She is not having side effects.  She is following a Regular diet. She is not exercising. She does not smoke.   Use of agents associated with hypertension: NSAIDS.   Outside blood pressures are not checked. Symptoms: No chest pain No chest pressure  No palpitations No syncope  No dyspnea No orthopnea  No paroxysmal nocturnal dyspnea No lower extremity edema   Pertinent labs: Lab Results  Component Value Date   CHOL 205 (H) 04/21/2018   HDL 50 04/21/2018   LDLCALC 125 (H) 04/21/2018   TRIG 152 (H) 04/21/2018   CHOLHDL 4.1 04/21/2018   Lab Results  Component Value Date   NA 141 04/21/2018   K 3.6 04/21/2018   CREATININE 1.14 (H) 04/21/2018   GFRNONAA 46 (L) 04/21/2018   GFRAA 53 (L) 04/21/2018   GLUCOSE 146 (H) 04/21/2018     The 10-year ASCVD risk score Mikey Bussing DC Jr., et al., 2013) is: 74.8%   --------------------------------------------------------------------------------------------------- Vitamin D  deficiency, follow-up  Lab Results  Component Value Date   VD25OH 27.5 (L) 04/21/2018   VD25OH 35.5 08/05/2016   VD25OH 30.7 10/04/2014   CALCIUM 9.7 04/21/2018   CALCIUM 9.6 08/05/2016   CALCIUM 9.4 08/07/2015   Wt Readings from Last 3 Encounters:  08/15/19 167 lb (75.8 kg)  04/20/18 166 lb (75.3 kg)  03/08/18 166 lb 3.2 oz (75.4 kg)    She was last seen for vitamin D deficiency 1 years ago.  Management since that visit includes counseling patient to consistently take Vitamin D supplement every  day. She reports good compliance with treatment. She is not having side effects.   Symptoms: No change in energy level No numbness or tingling  No bone pain No unexplained fracture   ---------------------------------------------------------------------------------------------------  Lipid/Cholesterol, Follow-up  Last lipid panel Other pertinent labs  Lab Results  Component Value Date   CHOL 205 (H) 04/21/2018   HDL 50 04/21/2018   LDLCALC 125 (H) 04/21/2018   TRIG 152 (H) 04/21/2018   CHOLHDL 4.1 04/21/2018   Lab Results  Component Value Date   ALT 14 04/21/2018   AST 14 04/21/2018   PLT 220 08/07/2015   TSH 1.660 08/05/2016     She was last seen for this 1 years ago.  Management since that visit includes recommend starting Ezetimibe 10mg  daily. Patient refused starting any prescription medications and asked to Red yeast rice.  She reports good compliance with treatment. She is not having side effects.   Symptoms: No chest pain No chest pressure/discomfort  No dyspnea No lower extremity edema  No numbness or tingling of extremity No orthopnea  No palpitations No paroxysmal nocturnal dyspnea  No speech difficulty No syncope   Current diet: regular diet Current exercise: none  The 10-year ASCVD risk score Mikey Bussing DC Jr., et al., 2013) is: 74.8%  ---------------------------------------------------------------------------------------------------      Medications: Outpatient Medications Prior to Visit  Medication Sig  . aspirin 81 MG tablet Take 81 mg by mouth daily.  Marland Kitchen atenolol (TENORMIN) 25 MG tablet Take 1 tablet (25 mg total) by mouth daily.  . Cholecalciferol (VITAMIN D-3) 5000 UNITS TABS Take 1 tablet by mouth every 7 (seven) days.  . clobetasol cream (TEMOVATE) [id number] % Apply 1 application topically 2 (two) times daily. PRN  . hydrochlorothiazide (HYDRODIURIL) 25 MG tablet TAKE 1 TABLET BY MOUTH ONCE DAILY *NEED OFFICE VISIT FOR MORE REFILLS*  . ONETOUCH ULTRA  test strip USE AS DIRECTED TO CHECK BLOOD SUGAR TWICE DAILY   No facility-administered medications prior to visit.    Review of Systems  Constitutional: Negative for chills, fatigue and fever.  HENT: Negative for congestion, ear pain, rhinorrhea, sneezing and sore throat.   Eyes: Negative.  Negative for pain and redness.  Respiratory: Negative for cough, shortness of breath and wheezing.   Cardiovascular: Negative for chest pain and leg swelling.  Gastrointestinal: Negative for abdominal pain, blood in stool, constipation, diarrhea and nausea.  Endocrine: Negative for polydipsia and polyphagia.  Genitourinary: Negative.  Negative for dysuria, flank pain, hematuria, pelvic pain, vaginal bleeding and vaginal discharge.  Musculoskeletal: Negative for arthralgias, back pain, gait problem and joint swelling.  Skin: Negative for rash.  Neurological: Negative.  Negative for dizziness, tremors, seizures, weakness, light-headedness, numbness and headaches.  Hematological: Negative for adenopathy.  Psychiatric/Behavioral: Negative.  Negative for behavioral problems, confusion and dysphoric mood. The patient is not nervous/anxious and is not hyperactive.        Objective    BP Marland Kitchen)  178/62 (BP Location: Left Arm, Cuff Size: Normal)   Pulse (!) 48   Temp (!) 97.5 F (36.4 C) (Oral)   Resp 16   Wt 167 lb (75.8 kg)   SpO2 95% Comment: room air  BMI 28.67 kg/m     Physical Exam   General: Appearance:     Overweight female in no acute distress  Eyes:    PERRL, conjunctiva/corneas clear, EOM's intact       Lungs:     Clear to auscultation bilaterally, respirations unlabored  Heart:    Bradycardic. Normal rhythm. No murmurs, rubs, or gallops.   MS:   All extremities are intact.   Neurologic:   Awake, alert, oriented x 3. No apparent focal neurological           defect.         Assessment & Plan     1. Type 2 diabetes mellitus with diabetic neuropathy, without long-term current use of  insulin (HCC) She states her neuropathy is keeping her up at night. She was previously prescribed gabapentin BID but quit  Because it made her very sleepy. I suggested trying it hs only , or trying something like Lyrica and she said she will think about it.  Recommended routine eye exam  2. Type 2 diabetes mellitus with stage 3a chronic kidney disease, without long-term current use of insulin (HCC) Diet controlled. Check labs.  - HgB A1c - CBC - Microalbumin, urine  3. Hyperlipidemia, unspecified hyperlipidemia type Currently diet controlled.  - Lipid panel - Comprehensive Metabolic Panel (CMET)  4. Benign hypertension with chronic kidney disease, stage III Uncontrolled. Advised she will needs some changes in her medication after reviewing labs. Would ideally like to put her on ARB is her potassium levels are normal.   5. Vitamin D deficiency  - VITAMIN D 25 Hydroxy (Vit-D Deficiency, Fractures)  6. Stage 3a chronic kidney disease   7. Estrogen deficiency  - DG Bone density [place]; Future  9. Personal history of colonic polyps Is long overdue for follow up colonoscopy which was last done in 2013. She hesitantly agreed to referral.  - Ambulatory referral to gastroenterology for colonoscopy       The entirety of the information documented in the History of Present Illness, Review of Systems and Physical Exam were personally obtained by me. Portions of this information were initially documented by the CMA and reviewed by me for thoroughness and accuracy.      Lelon Huh, MD  [facility] [phone] (phone) [phone] (fax)  [place]

## 2019-08-15 ENCOUNTER — Other Ambulatory Visit: Payer: Self-pay

## 2019-08-15 ENCOUNTER — Encounter: Payer: Self-pay | Admitting: Family Medicine

## 2019-08-15 ENCOUNTER — Ambulatory Visit (INDEPENDENT_AMBULATORY_CARE_PROVIDER_SITE_OTHER): Payer: PPO | Admitting: Family Medicine

## 2019-08-15 VITALS — BP 178/62 | HR 48 | Temp 97.5°F | Resp 16 | Wt 167.0 lb

## 2019-08-15 DIAGNOSIS — E559 Vitamin D deficiency, unspecified: Secondary | ICD-10-CM | POA: Diagnosis not present

## 2019-08-15 DIAGNOSIS — N1831 Chronic kidney disease, stage 3a: Secondary | ICD-10-CM

## 2019-08-15 DIAGNOSIS — E1121 Type 2 diabetes mellitus with diabetic nephropathy: Secondary | ICD-10-CM

## 2019-08-15 DIAGNOSIS — N183 Chronic kidney disease, stage 3 unspecified: Secondary | ICD-10-CM | POA: Diagnosis not present

## 2019-08-15 DIAGNOSIS — E2839 Other primary ovarian failure: Secondary | ICD-10-CM | POA: Diagnosis not present

## 2019-08-15 DIAGNOSIS — Z1211 Encounter for screening for malignant neoplasm of colon: Secondary | ICD-10-CM

## 2019-08-15 DIAGNOSIS — I129 Hypertensive chronic kidney disease with stage 1 through stage 4 chronic kidney disease, or unspecified chronic kidney disease: Secondary | ICD-10-CM

## 2019-08-15 DIAGNOSIS — E785 Hyperlipidemia, unspecified: Secondary | ICD-10-CM

## 2019-08-15 DIAGNOSIS — E119 Type 2 diabetes mellitus without complications: Secondary | ICD-10-CM | POA: Diagnosis not present

## 2019-08-15 DIAGNOSIS — E1122 Type 2 diabetes mellitus with diabetic chronic kidney disease: Secondary | ICD-10-CM

## 2019-08-15 DIAGNOSIS — E1140 Type 2 diabetes mellitus with diabetic neuropathy, unspecified: Secondary | ICD-10-CM

## 2019-08-15 DIAGNOSIS — Z86010 Personal history of colonic polyps: Secondary | ICD-10-CM | POA: Diagnosis not present

## 2019-08-15 NOTE — Patient Instructions (Addendum)
.   Please review the attached list of medications and notify my office if there are any errors.   . Please bring all of your medications to every appointment so we can make sure that our medication list is the same as yours.   Please contact your eyecare professional to schedule a routine eye exam. I recommend seeing Dr. Jomarie Longs at [phone] or the [facility] at [phone]   Let me know if you want to try night time gabapentin for neuropathy, or try a different medications such as Lyrica

## 2019-08-16 LAB — CBC
Hematocrit: 35.8 % (ref 34.0–46.6)
Hemoglobin: 12.4 g/dL (ref 11.1–15.9)
MCH: 32.3 pg (ref 26.6–33.0)
MCHC: 34.6 g/dL (ref 31.5–35.7)
MCV: 93 fL (ref 79–97)
Platelets: 241 10*3/uL (ref 150–450)
RBC: 3.84 x10E6/uL (ref 3.77–5.28)
RDW: 13.1 % (ref 11.7–15.4)
WBC: 6.2 10*3/uL (ref 3.4–10.8)

## 2019-08-16 LAB — COMPREHENSIVE METABOLIC PANEL
ALT: 12 IU/L (ref 0–32)
AST: 15 IU/L (ref 0–40)
Albumin/Globulin Ratio: 1.5 (ref 1.2–2.2)
Albumin: 4.1 g/dL (ref 3.7–4.7)
Alkaline Phosphatase: 44 IU/L — ABNORMAL LOW (ref 48–121)
BUN/Creatinine Ratio: 15 (ref 12–28)
BUN: 16 mg/dL (ref 8–27)
Bilirubin Total: 0.5 mg/dL (ref 0.0–1.2)
CO2: 28 mmol/L (ref 20–29)
Calcium: 9.7 mg/dL (ref 8.7–10.3)
Chloride: 101 mmol/L (ref 96–106)
Creatinine, Ser: 1.07 mg/dL — ABNORMAL HIGH (ref 0.57–1.00)
GFR calc Af Amer: 57 mL/min/{1.73_m2} — ABNORMAL LOW (ref >59)
GFR calc non Af Amer: 49 mL/min/{1.73_m2} — ABNORMAL LOW (ref >59)
Globulin, Total: 2.8 g/dL (ref 1.5–4.5)
Glucose: 124 mg/dL — ABNORMAL HIGH (ref 65–99)
Potassium: 3.5 mmol/L (ref 3.5–5.2)
Sodium: 143 mmol/L (ref 134–144)
Total Protein: 6.9 g/dL (ref 6.0–8.5)

## 2019-08-16 LAB — LIPID PANEL
Chol/HDL Ratio: 3.7 ratio (ref 0.0–4.4)
Cholesterol, Total: 177 mg/dL (ref 100–199)
HDL: 48 mg/dL (ref >39)
LDL Chol Calc (NIH): 101 mg/dL — ABNORMAL HIGH (ref 0–99)
Triglycerides: 161 mg/dL — ABNORMAL HIGH (ref 0–149)
VLDL Cholesterol Cal: 28 mg/dL (ref 5–40)

## 2019-08-16 LAB — VITAMIN D 25 HYDROXY (VIT D DEFICIENCY, FRACTURES): Vit D, 25-Hydroxy: 29.9 ng/mL — ABNORMAL LOW (ref 30.0–100.0)

## 2019-08-16 LAB — HEMOGLOBIN A1C
Est. average glucose Bld gHb Est-mCnc: 137 mg/dL
Hgb A1c MFr Bld: 6.4 % — ABNORMAL HIGH (ref 4.8–5.6)

## 2019-08-16 LAB — MICROALBUMIN, URINE: Microalbumin, Urine: 12.9 ug/mL

## 2019-08-17 ENCOUNTER — Telehealth: Payer: Self-pay

## 2019-08-17 DIAGNOSIS — I129 Hypertensive chronic kidney disease with stage 1 through stage 4 chronic kidney disease, or unspecified chronic kidney disease: Secondary | ICD-10-CM

## 2019-08-17 NOTE — Telephone Encounter (Signed)
Patient advised of message below. She wants to know if Dr. Caryn Section could increase the dose of HCTZ for better blood pressure control instead of adding a new medication? She says she is allergic to several blood pressure medications and she is a little afraid to try any new medications. Please advise.   Pharmacy: [place].

## 2019-08-17 NOTE — Telephone Encounter (Signed)
-----   Message from Birdie Sons, MD sent at 08/16/2019  2:14 PM EDT ----- A1c is good at 6.4. cholesterol is better at 177.  Need to add valsartan 80mg  once a day to current meds for better blood pressure control. Please send prescription for #30 with one refill and schedule follow up office visit for blood pressure in 4 weeks.

## 2019-08-17 NOTE — Telephone Encounter (Signed)
Valsartan is the medication of choice because it prevents kidney disease in diabetes and it is extremely rare to have allergic reactions. It is not at all related to any other medications she has reacted.  Higher dose of hctz can cause kidney and electrolyte problems.

## 2019-08-18 MED ORDER — VALSARTAN 80 MG PO TABS: 80.0000 mg | ORAL_TABLET | Freq: Every day | ORAL | 1 refills | Status: DC

## 2019-08-18 NOTE — Telephone Encounter (Signed)
Patient advised and verbalized understanding. Prescription sent into pharmacy. F/U appointment scheduled 09/22/2019 at 10:40am

## 2019-09-20 NOTE — Progress Notes (Signed)
Established patient visit   Patient: Tracie Gray   DOB: [birth year]   79 y.o. Female  MRN: [id number] Visit Date: 09/22/2019  Today's healthcare provider: Lelon Huh, MD   Chief Complaint  Patient presents with  . Hypertension   I,Latasha Walston,acting as a scribe for Lelon Huh, MD.,have documented all relevant documentation on the behalf of Lelon Huh, MD,as directed by  Lelon Huh, MD while in the presence of Lelon Huh, MD.  Subjective    HPI  Hypertension, follow-up  BP Readings from Last 3 Encounters:  09/22/19 (!) 168/69  08/15/19 (!) 178/62  04/20/18 (!) 142/72   Wt Readings from Last 3 Encounters:  09/22/19 167 lb 9.6 oz (76 kg)  08/15/19 167 lb (75.8 kg)  04/20/18 166 lb (75.3 kg)     She was last seen for hypertension 1 months ago.  BP at that visit was 178/62. Management since that visit includes added Valsartan 80 mg.  She reports good compliance with treatment. She is not having side effects.  She is following a Regular diet. She is exercising. She does not smoke.  Use of agents associated with hypertension: none.   Outside blood pressures are being checked BP readings are fluctuating, lowest is in the 140s.  Symptoms: No chest pain No chest pressure  No palpitations No syncope  No dyspnea No orthopnea  No paroxysmal nocturnal dyspnea No lower extremity edema   Pertinent labs: Lab Results  Component Value Date   CHOL 177 08/15/2019   HDL 48 08/15/2019   LDLCALC 101 (H) 08/15/2019   TRIG 161 (H) 08/15/2019   CHOLHDL 3.7 08/15/2019   Lab Results  Component Value Date   NA 143 08/15/2019   K 3.5 08/15/2019   CREATININE 1.07 (H) 08/15/2019   GFRNONAA 49 (L) 08/15/2019   GFRAA 57 (L) 08/15/2019   GLUCOSE 124 (H) 08/15/2019     The 10-year ASCVD risk score Mikey Bussing DC Jr., et al., 2013) is: 70.7%    ---------------------------------------------------------------------------------------------------     Medications: Outpatient Medications Prior to Visit  Medication Sig  . aspirin 81 MG tablet Take 81 mg by mouth daily.  Marland Kitchen atenolol (TENORMIN) 25 MG tablet Take 1 tablet (25 mg total) by mouth daily.  . Cholecalciferol (VITAMIN D-3) 5000 UNITS TABS Take 1 tablet by mouth every 7 (seven) days.  . clobetasol cream (TEMOVATE) 9.48 % Apply 1 application topically 2 (two) times daily. PRN  . hydrochlorothiazide (HYDRODIURIL) 25 MG tablet TAKE 1 TABLET BY MOUTH ONCE DAILY *NEED OFFICE VISIT FOR MORE REFILLS*  . ONETOUCH ULTRA test strip USE AS DIRECTED TO CHECK BLOOD SUGAR TWICE DAILY  . valsartan (DIOVAN) 80 MG tablet Take 1 tablet (80 mg total) by mouth daily.   No facility-administered medications prior to visit.    Review of Systems  Constitutional: Negative.   Respiratory: Negative.   Cardiovascular: Negative.   Musculoskeletal: Negative.      Objective    BP 138/64   Pulse (!) 54   Temp (!) 97.3 F (36.3 C) (Temporal)   Ht 5\' 4"  (1.626 m)   Wt 167 lb 9.6 oz (76 kg)   BMI 28.77 kg/m   Physical Exam   General: Appearance:     Overweight female in no acute distress  Eyes:    PERRL, conjunctiva/corneas clear, EOM's intact       Lungs:     Clear to auscultation bilaterally, respirations unlabored  Heart:    Bradycardic. Normal rhythm. No murmurs,  rubs, or gallops.   MS:   All extremities are intact.   Neurologic:   Awake, alert, oriented x 3. No apparent focal neurological           defect.        Assessment & Plan     1. Benign hypertension with chronic kidney disease, stage III Doing well with addition of valsartan. Continue 80mg  for now. She needs 90 day refill sent to [facility] before 10-18-2019   No follow-ups on file.      The entirety of the information documented in the History of Present Illness, Review of Systems and Physical Exam were  personally obtained by me. Portions of this information were initially documented by the CMA and reviewed by me for thoroughness and accuracy.      Lelon Huh, MD  [facility] [phone] (phone) [phone] (fax)  [place]

## 2019-09-22 ENCOUNTER — Other Ambulatory Visit: Payer: Self-pay

## 2019-09-22 ENCOUNTER — Ambulatory Visit (INDEPENDENT_AMBULATORY_CARE_PROVIDER_SITE_OTHER): Payer: PPO | Admitting: Family Medicine

## 2019-09-22 ENCOUNTER — Encounter: Payer: Self-pay | Admitting: Family Medicine

## 2019-09-22 VITALS — BP 138/64 | HR 54 | Temp 97.3°F | Ht 64.0 in | Wt 167.6 lb

## 2019-09-22 DIAGNOSIS — I129 Hypertensive chronic kidney disease with stage 1 through stage 4 chronic kidney disease, or unspecified chronic kidney disease: Secondary | ICD-10-CM

## 2019-09-22 DIAGNOSIS — N183 Chronic kidney disease, stage 3 unspecified: Secondary | ICD-10-CM

## 2019-09-22 NOTE — Patient Instructions (Signed)
.   Please review the attached list of medications and notify my office if there are any errors.   . Please bring all of your medications to every appointment so we can make sure that our medication list is the same as yours.   

## 2019-10-05 ENCOUNTER — Encounter: Payer: Self-pay | Admitting: Family Medicine

## 2019-10-12 ENCOUNTER — Other Ambulatory Visit: Payer: Self-pay | Admitting: Family Medicine

## 2019-10-12 DIAGNOSIS — N183 Chronic kidney disease, stage 3 unspecified: Secondary | ICD-10-CM

## 2019-10-12 MED ORDER — VALSARTAN 80 MG PO TABS: 80.0000 mg | ORAL_TABLET | Freq: Every day | ORAL | 4 refills | Status: DC

## 2019-10-17 ENCOUNTER — Other Ambulatory Visit: Payer: Self-pay

## 2019-10-18 ENCOUNTER — Other Ambulatory Visit: Payer: Self-pay | Admitting: Family Medicine

## 2019-10-18 DIAGNOSIS — I10 Essential (primary) hypertension: Secondary | ICD-10-CM

## 2019-10-24 DIAGNOSIS — E119 Type 2 diabetes mellitus without complications: Secondary | ICD-10-CM | POA: Diagnosis not present

## 2019-10-24 DIAGNOSIS — I10 Essential (primary) hypertension: Secondary | ICD-10-CM | POA: Diagnosis not present

## 2019-10-24 DIAGNOSIS — R001 Bradycardia, unspecified: Secondary | ICD-10-CM | POA: Diagnosis not present

## 2019-12-21 ENCOUNTER — Other Ambulatory Visit: Payer: Self-pay | Admitting: Family Medicine

## 2019-12-21 DIAGNOSIS — I10 Essential (primary) hypertension: Secondary | ICD-10-CM

## 2020-01-03 ENCOUNTER — Ambulatory Visit: Payer: PPO | Admitting: Family Medicine

## 2020-01-03 NOTE — Progress Notes (Deleted)
     Established patient visit   Patient: Tracie Gray   DOB: July 05, 1940   79 y.o. Female  MRN: [id number] Visit Date: 01/03/2020  Today's healthcare provider: Lelon Huh, MD   No chief complaint on file.  Subjective    HPI  Hypertension, follow-up  BP Readings from Last 3 Encounters:  09/22/19 138/64  08/15/19 (!) 178/62  04/20/18 (!) 142/72   Wt Readings from Last 3 Encounters:  09/22/19 167 lb 9.6 oz (76 kg)  08/15/19 167 lb (75.8 kg)  04/20/18 166 lb (75.3 kg)     She was last seen for hypertension 3 months ago.  BP at that visit was 138/64. Management since that visit includes no change.  She reports good compliance with treatment. She is not having side effects.  She is following a Regular diet. She {is/is not:9024} exercising. She does not smoke.  Use of agents associated with hypertension: none.   Outside blood pressures are {***enter patient reported home BP readings, or 'not being checked':1}. Symptoms: No chest pain No chest pressure  No palpitations No syncope  No dyspnea No orthopnea  No paroxysmal nocturnal dyspnea No lower extremity edema   Pertinent labs: Lab Results  Component Value Date   CHOL 177 08/15/2019   HDL 48 08/15/2019   LDLCALC 101 (H) 08/15/2019   TRIG 161 (H) 08/15/2019   CHOLHDL 3.7 08/15/2019   Lab Results  Component Value Date   NA 143 08/15/2019   K 3.5 08/15/2019   CREATININE 1.07 (H) 08/15/2019   GFRNONAA 49 (L) 08/15/2019   GFRAA 57 (L) 08/15/2019   GLUCOSE 124 (H) 08/15/2019     The 10-year ASCVD risk score Mikey Bussing DC Jr., et al., 2013) is: 65.3%   ---------------------------------------------------------------------------------------------------   {Show patient history (optional):23778::" "}   Medications: Outpatient Medications Prior to Visit  Medication Sig  . aspirin 81 MG tablet Take 81 mg by mouth daily.  Marland Kitchen atenolol (TENORMIN) 25 MG tablet Take 1 tablet (25 mg total) by mouth daily.  .  Cholecalciferol (VITAMIN D-3) 5000 UNITS TABS Take 1 tablet by mouth every 7 (seven) days.  . clobetasol cream (TEMOVATE) 9.16 % Apply 1 application topically 2 (two) times daily. PRN  . hydrochlorothiazide (HYDRODIURIL) 25 MG tablet TAKE 1 TABLET BY MOUTH ONCE DAILY *NEED OFFICE VISIT FOR MORE REFILLS*  . ONETOUCH ULTRA test strip USE AS DIRECTED TO CHECK BLOOD SUGAR TWICE DAILY  . valsartan (DIOVAN) 80 MG tablet Take 1 tablet (80 mg total) by mouth daily.   No facility-administered medications prior to visit.    Review of Systems  Constitutional: Negative.   Respiratory: Negative.   Cardiovascular: Negative.   Musculoskeletal: Negative.     {Heme  Chem  Endocrine  Serology  Results Review (optional):23779::" "}  Objective    There were no vitals taken for this visit. {Show previous vital signs (optional):23777::" "}  Physical Exam  ***  No results found for any visits on 01/03/20.  Assessment & Plan     ***  No follow-ups on file.      {provider attestation***:1}   Lelon Huh, MD  [facility] [phone] (phone) [phone] (fax)  [place]

## 2020-03-05 ENCOUNTER — Telehealth: Payer: Self-pay | Admitting: Family Medicine

## 2020-03-05 NOTE — Telephone Encounter (Signed)
Copied from [place] [id number]. Topic: Medicare AWV >> Mar 05, 2020 11:46 AM Cher Nakai R wrote: Reason for CRM:  Left message for patient to call back and schedule Medicare Annual Wellness Visit (AWV) in office.   If not able to come in office, please offer to do virtually.   Last AWV  03/13/2019  Please schedule at anytime with [facility] Advisor.  If any questions, please contact me at [phone]

## 2020-08-06 NOTE — Telephone Encounter (Signed)
error 

## 2020-12-25 ENCOUNTER — Other Ambulatory Visit: Payer: Self-pay | Admitting: Family Medicine

## 2020-12-25 NOTE — Telephone Encounter (Signed)
Patient called to verify the pharmacy to send Atenolol, she says [facility].

## 2020-12-25 NOTE — Telephone Encounter (Signed)
Medication Refill - Medication:  atenolol (TENORMIN) 25 MG tablet   Has the patient contacted their pharmacy? Yes.   Contact pcp  Preferred Pharmacy (with phone number or street name):  [place], [place], [place] [address]  Phone:  [phone]  Fax:  [phone]   Has the patient been seen for an appointment in the last year OR does the patient have an upcoming appointment? Yes.    Agent: Please be advised that RX refills may take up to 3 business days. We ask that you follow-up with your pharmacy.

## 2020-12-25 NOTE — Telephone Encounter (Signed)
Requested medication (s) are due for refill today: expired medication  Requested medication (s) are on the active medication list: yes  Last refill:  02/09/2017 #90 4 refills  Future visit scheduled: yes in 2 months   Notes to clinic:  expired medication. Do you want to renew Rx? Appt scheduled for 02/24/21. Unable to schedule physical/wellness visit. Patient reports she has not been notified of  setting up appt for AWV . Reports her husband has been notified of all f/u visit. Please advise .     Requested Prescriptions  Pending Prescriptions Disp Refills   atenolol (TENORMIN) 25 MG tablet 90 tablet 4    Sig: Take 1 tablet (25 mg total) by mouth daily.     Cardiovascular:  Beta Blockers Failed - 12/25/2020  2:50 PM      Failed - Valid encounter within last 6 months    Recent Outpatient Visits           1 year ago Benign hypertension with chronic kidney disease, stage III   [facility] Birdie Sons, MD   1 year ago Type 2 diabetes mellitus with diabetic neuropathy, without long-term current use of insulin [facility])   [facility] Birdie Sons, MD   2 years ago Annual physical exam   [facility] Birdie Sons, MD   3 years ago Upper respiratory tract infection, unspecified type   [facility] Birdie Sons, MD   3 years ago Type 2 diabetes mellitus without complication, without long-term current use of insulin [facility])   [facility] Birdie Sons, MD       Future Appointments             In 2 months Fisher, Kirstie Peri, MD [facility]            Passed - Last BP in normal range    BP Readings from Last 1 Encounters:  09/22/19 138/64          Passed - Last Heart Rate in normal range    Pulse Readings from Last 1 Encounters:  09/22/19 (!) 54

## 2020-12-26 NOTE — Telephone Encounter (Signed)
  Contacted patient on 12/25/20 to review request for medication atenolol 25 mg. Patient reports she only has a few tabs left. Medication expired and has been ordered by Dr. Caryn Section in the past. Patient also reports she has not been notified by practice to keep on annual well visits. Patient reports she and her husband usually are seen the same day by Dr. Caryn Section but she has not been notified by practice to continue will visits. Please contact patient to schedule physical . Patient reports she has been a patient of [facility] for years and would like a call back to clarify appt. Can  you renew Rx? Appt scheduled for 02/24/21. Unable to schedule physical/wellness visit.  Please advise .

## 2021-01-17 ENCOUNTER — Other Ambulatory Visit: Payer: Self-pay | Admitting: Family Medicine

## 2021-01-17 DIAGNOSIS — I10 Essential (primary) hypertension: Secondary | ICD-10-CM

## 2021-02-24 ENCOUNTER — Encounter: Payer: Self-pay | Admitting: Family Medicine

## 2021-02-24 ENCOUNTER — Other Ambulatory Visit: Payer: Self-pay

## 2021-02-24 ENCOUNTER — Ambulatory Visit (INDEPENDENT_AMBULATORY_CARE_PROVIDER_SITE_OTHER): Payer: PPO | Admitting: Family Medicine

## 2021-02-24 VITALS — BP 148/62 | HR 44 | Resp 16 | Wt 164.1 lb

## 2021-02-24 DIAGNOSIS — E1122 Type 2 diabetes mellitus with diabetic chronic kidney disease: Secondary | ICD-10-CM

## 2021-02-24 DIAGNOSIS — I129 Hypertensive chronic kidney disease with stage 1 through stage 4 chronic kidney disease, or unspecified chronic kidney disease: Secondary | ICD-10-CM | POA: Diagnosis not present

## 2021-02-24 DIAGNOSIS — E559 Vitamin D deficiency, unspecified: Secondary | ICD-10-CM

## 2021-02-24 DIAGNOSIS — E785 Hyperlipidemia, unspecified: Secondary | ICD-10-CM | POA: Diagnosis not present

## 2021-02-24 DIAGNOSIS — E2839 Other primary ovarian failure: Secondary | ICD-10-CM

## 2021-02-24 DIAGNOSIS — N183 Hypertensive chronic kidney disease with stage 1 through stage 4 chronic kidney disease, or unspecified chronic kidney disease: Secondary | ICD-10-CM

## 2021-02-24 DIAGNOSIS — R42 Dizziness and giddiness: Secondary | ICD-10-CM

## 2021-02-24 DIAGNOSIS — R001 Bradycardia, unspecified: Secondary | ICD-10-CM | POA: Diagnosis not present

## 2021-02-24 DIAGNOSIS — N1831 Chronic kidney disease, stage 3a: Secondary | ICD-10-CM | POA: Diagnosis not present

## 2021-02-24 LAB — POCT GLYCOSYLATED HEMOGLOBIN (HGB A1C): Hemoglobin A1C: 6.7 % — AB (ref 4.0–5.6)

## 2021-02-24 MED ORDER — HYDROCHLOROTHIAZIDE 25 MG PO TABS: 50.0000 mg | ORAL_TABLET | Freq: Every day | ORAL | Status: DC

## 2021-02-24 MED ORDER — HYDROCHLOROTHIAZIDE 25 MG PO TABS: 25.0000 mg | ORAL_TABLET | Freq: Every day | ORAL | Status: DC

## 2021-02-24 NOTE — Patient Instructions (Addendum)
.   Please review the attached list of medications and notify my office if there are any errors.   . Please contact your eyecare professional to schedule a routine eye exam  

## 2021-02-24 NOTE — Progress Notes (Addendum)
Established patient visit   Patient: Tracie Gray   DOB: [birth year]   80 y.o. Female  MRN: [id number] Visit Date: 02/24/2021  Today's healthcare provider: Lelon Huh, MD   Chief Complaint  Patient presents with   Hypertension   Diabetes   Hyperlipidemia   Subjective    HPI  Diabetes Mellitus Type II, follow-up  Lab Results  Component Value Date   HGBA1C 6.4 (H) 08/15/2019   HGBA1C 6.5 (A) 04/20/2018   HGBA1C 6.2 02/09/2017   Last seen for diabetes 5 months ago.  Management since then includes continuing the same treatment. She reports excellent compliance with treatment. She is not having side effects. None  Home blood sugar records:  not being checked  Episodes of hypoglycemia? No    Current insulin regiment: none Most Recent Eye Exam:   --------------------------------------------------------------------------------------------------- Hypertension, follow-up  BP Readings from Last 3 Encounters:  02/24/21 (!) 142/37  09/22/19 138/64  08/15/19 (!) 178/62   Wt Readings from Last 3 Encounters:  02/24/21 164 lb 1.6 oz (74.4 kg)  09/22/19 167 lb 9.6 oz (76 kg)  08/15/19 167 lb (75.8 kg)     She was last seen for hypertension 5 months ago.  BP at that visit was 138/64. Management since that visit includes none. She reports excellent compliance with treatment. She is having side effects.  She is not exercising. She is adherent to low salt diet.   Outside blood pressures are being checked .  She does not smoke.  Use of agents associated with hypertension: NSAIDS.   --------------------------------------------------------------------------------------------------- Lipid/Cholesterol, follow-up  Last Lipid Panel: Lab Results  Component Value Date   CHOL 177 08/15/2019   LDLCALC 101 (H) 08/15/2019   HDL 48 08/15/2019   TRIG 161 (H) 08/15/2019    She was last seen for this 16 months ago.  Management since that visit includes  none.   Symptoms: No appetite changes No foot ulcerations  No chest pain No chest pressure/discomfort  No dyspnea No orthopnea  No fatigue Yes lower extremity edema  No palpitations No paroxysmal nocturnal dyspnea  No nausea Yes numbness or tingling of extremity  Yes polydipsia Yes polyuria  No speech difficulty No syncope   She is following a Regular diet. Current exercise: no regular exercise  Last metabolic panel Lab Results  Component Value Date   GLUCOSE 124 (H) 08/15/2019   NA 143 08/15/2019   K 3.5 08/15/2019   BUN 16 08/15/2019   CREATININE 1.07 (H) 08/15/2019   GFRNONAA 49 (L) 08/15/2019   CALCIUM 9.7 08/15/2019   AST 15 08/15/2019   ALT 12 08/15/2019   The ASCVD Risk score (Arnett DK, et al., 2019) failed to calculate for the following reasons:   The 2019 ASCVD risk score is only valid for ages 85 to 70  ---------------------------------------------------------------------------------------------------     Medications: Outpatient Medications Prior to Visit  Medication Sig   aspirin 81 MG tablet Take 81 mg by mouth daily.   atenolol (TENORMIN) 25 MG tablet Take 1 tablet (25 mg total) by mouth daily.   Cholecalciferol (VITAMIN D-3) 5000 UNITS TABS Take 1 tablet by mouth every 7 (seven) days.   clobetasol cream (TEMOVATE) 3.53 % Apply 1 application topically 2 (two) times daily. PRN   hydrochlorothiazide (HYDRODIURIL) 25 MG tablet TAKE 1 TABLET BY MOUTH ONCE DAILY *NEED OFFICE VISIT FOR MORE REFILLS*   ONETOUCH ULTRA test strip USE AS DIRECTED TO CHECK BLOOD SUGAR TWICE DAILY   valsartan (DIOVAN)  80 MG tablet Take 1 tablet (80 mg total) by mouth daily.   No facility-administered medications prior to visit.    Review of Systems  Constitutional:  Positive for fatigue.  Cardiovascular:  Negative for chest pain, palpitations and leg swelling.  Neurological:  Positive for dizziness.      Objective    BP (!) 148/62   Pulse (!) 48   Resp 16   Wt 164 lb  1.6 oz (74.4 kg)   SpO2 100%   BMI 28.17 kg/m    Physical Exam    General: Appearance:     Overweight female in no acute distress  Eyes:    PERRL, conjunctiva/corneas clear, EOM's intact       Lungs:     Clear to auscultation bilaterally, respirations unlabored  Heart:    Bradycardic. Normal rhythm. No murmurs, rubs, or gallops.    MS:   All extremities are intact.    Neurologic:   Awake, alert, oriented x 3. No apparent focal neurological defect.        Results for orders placed or performed in visit on 02/24/21  POCT glycosylated hemoglobin (Hb A1C)  Result Value Ref Range   Hemoglobin A1C 6.7 (A) 4.0 - 5.6 %   EKG: Marked bradycardia. HR=44. Undetermined rhythm   Assessment & Plan     1. Type 2 diabetes mellitus with stage 3a chronic kidney disease, without long-term current use of insulin (HCC) Well controlled. .  2. Bradycardia  - Ambulatory referral to Cardiology  3. Dizziness She describes this a light headed feeling usually just after standing up from sitting position.   - Ambulatory referral to Cardiology  Suspect this is caused or aggravated by bradycardia. Discussed increasing hctz for better control of systolic blood pressure, but this may aggravate dizziness and I would like her to first see cardiologist regarding bradycardia.   4. Hyperlipidemia, unspecified hyperlipidemia type Diet controlled.  - CBC - Comprehensive metabolic panel - Lipid panel  5. Benign hypertension with chronic kidney disease, stage III (HCC) Systolic not to goal with wide pulse pressure. Hold off on adjusting medication until sees cardiology regarding bradycardia. Will recheck in 6 weeks.   6. Stage 3a chronic kidney disease ([place])   7. Vitamin D deficiency  - VITAMIN D 25 Hydroxy (Vit-D Deficiency, Fractures)  8. Estrogen deficiency  - DG Bone density [place]; Future   She anticipates getting flu shot at her pharmacy.        The entirety of the information  documented in the History of Present Illness, Review of Systems and Physical Exam were personally obtained by me. Portions of this information were initially documented by the CMA and reviewed by me for thoroughness and accuracy.     Lelon Huh, MD  [facility] [phone] (phone) [phone] (fax)  [place]

## 2021-02-25 LAB — COMPREHENSIVE METABOLIC PANEL
ALT: 13 IU/L (ref 0–32)
AST: 17 IU/L (ref 0–40)
Albumin/Globulin Ratio: 1.4 (ref 1.2–2.2)
Albumin: 4.2 g/dL (ref 3.6–4.6)
Alkaline Phosphatase: 39 IU/L — ABNORMAL LOW (ref 44–121)
BUN/Creatinine Ratio: 13 (ref 12–28)
BUN: 16 mg/dL (ref 8–27)
Bilirubin Total: 0.7 mg/dL (ref 0.0–1.2)
CO2: 28 mmol/L (ref 20–29)
Calcium: 9.6 mg/dL (ref 8.7–10.3)
Chloride: 105 mmol/L (ref 96–106)
Creatinine, Ser: 1.21 mg/dL — ABNORMAL HIGH (ref 0.57–1.00)
Globulin, Total: 2.9 g/dL (ref 1.5–4.5)
Glucose: 137 mg/dL — ABNORMAL HIGH (ref 70–99)
Potassium: 4.2 mmol/L (ref 3.5–5.2)
Sodium: 145 mmol/L — ABNORMAL HIGH (ref 134–144)
Total Protein: 7.1 g/dL (ref 6.0–8.5)
eGFR: 45 mL/min/{1.73_m2} — ABNORMAL LOW (ref >59)

## 2021-02-25 LAB — LIPID PANEL
Chol/HDL Ratio: 4.3 ratio (ref 0.0–4.4)
Cholesterol, Total: 205 mg/dL — ABNORMAL HIGH (ref 100–199)
HDL: 48 mg/dL (ref >39)
LDL Chol Calc (NIH): 132 mg/dL — ABNORMAL HIGH (ref 0–99)
Triglycerides: 142 mg/dL (ref 0–149)
VLDL Cholesterol Cal: 25 mg/dL (ref 5–40)

## 2021-02-25 LAB — CBC
Hematocrit: 40.5 % (ref 34.0–46.6)
Hemoglobin: 13.7 g/dL (ref 11.1–15.9)
MCH: 31.6 pg (ref 26.6–33.0)
MCHC: 33.8 g/dL (ref 31.5–35.7)
MCV: 94 fL (ref 79–97)
Platelets: 245 10*3/uL (ref 150–450)
RBC: 4.33 x10E6/uL (ref 3.77–5.28)
RDW: 13 % (ref 11.7–15.4)
WBC: 7 10*3/uL (ref 3.4–10.8)

## 2021-02-25 LAB — VITAMIN D 25 HYDROXY (VIT D DEFICIENCY, FRACTURES): Vit D, 25-Hydroxy: 32.3 ng/mL (ref 30.0–100.0)

## 2021-02-27 DIAGNOSIS — R001 Bradycardia, unspecified: Secondary | ICD-10-CM | POA: Diagnosis not present

## 2021-04-04 ENCOUNTER — Ambulatory Visit: Payer: PPO | Admitting: Family Medicine

## 2021-04-11 ENCOUNTER — Encounter: Payer: Self-pay | Admitting: Family Medicine

## 2021-04-11 ENCOUNTER — Ambulatory Visit (INDEPENDENT_AMBULATORY_CARE_PROVIDER_SITE_OTHER): Payer: PPO | Admitting: Family Medicine

## 2021-04-11 ENCOUNTER — Other Ambulatory Visit: Payer: Self-pay

## 2021-04-11 VITALS — BP 140/59 | HR 63 | Temp 98.5°F | Wt 164.0 lb

## 2021-04-11 DIAGNOSIS — I129 Hypertensive chronic kidney disease with stage 1 through stage 4 chronic kidney disease, or unspecified chronic kidney disease: Secondary | ICD-10-CM | POA: Diagnosis not present

## 2021-04-11 DIAGNOSIS — N183 Chronic kidney disease, stage 3 unspecified: Secondary | ICD-10-CM | POA: Diagnosis not present

## 2021-04-11 DIAGNOSIS — R001 Bradycardia, unspecified: Secondary | ICD-10-CM | POA: Diagnosis not present

## 2021-04-11 DIAGNOSIS — N1831 Chronic kidney disease, stage 3a: Secondary | ICD-10-CM | POA: Diagnosis not present

## 2021-04-11 MED ORDER — VALSARTAN 160 MG PO TABS: 160.0000 mg | ORAL_TABLET | Freq: Every day | ORAL | 0 refills | Status: DC

## 2021-04-11 NOTE — Progress Notes (Signed)
Established patient visit   Patient: Tracie Gray   DOB: 12/06/1940   81 y.o. Female  MRN: [id number] Visit Date: 04/11/2021  Today's healthcare provider: Lelon Huh, MD   No chief complaint on file.  Subjective    HPI  Hypertension, follow-up  BP Readings from Last 3 Encounters:  04/11/21 (!) 140/59  02/24/21 (!) 148/62  09/22/19 138/64   Wt Readings from Last 3 Encounters:  04/11/21 164 lb (74.4 kg)  02/24/21 164 lb 1.6 oz (74.4 kg)  09/22/19 167 lb 9.6 oz (76 kg)     She was last seen for hypertension 6 weeks ago.  BP at that visit was 148/62. Management since that visit includes reducing atenolol to 1/2 tablet daily due to bradycardia. She has since had follow up with Dr. Ubaldo Glassing who stopped her atenolol and ordered Holter monitor.  Was also noted to have reduced kidney functions and advised to drink 2-3 more glasses of water daily    She reports excellent compliance with treatment. She is not having side effects.  She is following a Low Sodium diet. She is exercising. She does not smoke.  Use of agents associated with hypertension: none.   Outside blood pressures are 140-150's/?. Symptoms: No chest pain No chest pressure  No palpitations No syncope  No dyspnea No orthopnea  No paroxysmal nocturnal dyspnea No lower extremity edema   Pertinent labs: Lab Results  Component Value Date   CHOL 205 (H) 02/24/2021   HDL 48 02/24/2021   LDLCALC 132 (H) 02/24/2021   TRIG 142 02/24/2021   CHOLHDL 4.3 02/24/2021   Lab Results  Component Value Date   NA 145 (H) 02/24/2021   K 4.2 02/24/2021   CREATININE 1.21 (H) 02/24/2021   EGFR 45 (L) 02/24/2021   GLUCOSE 137 (H) 02/24/2021   TSH 1.660 08/05/2016     The ASCVD Risk score (Arnett DK, et al., 2019) failed to calculate for the following reasons:   The 2019 ASCVD risk score is only valid for ages 47 to 54    ---------------------------------------------------------------------------------------------------   Medications: Outpatient Medications Prior to Visit  Medication Sig   aspirin 81 MG tablet Take 81 mg by mouth daily.   Cholecalciferol (VITAMIN D-3) 5000 UNITS TABS Take 1 tablet by mouth every 7 (seven) days.   clobetasol cream (TEMOVATE) 5.85 % Apply 1 application topically 2 (two) times daily. PRN   hydrochlorothiazide (HYDRODIURIL) 25 MG tablet Take 1 tablet (25 mg total) by mouth daily.   ONETOUCH ULTRA test strip USE AS DIRECTED TO CHECK BLOOD SUGAR TWICE DAILY   atenolol (TENORMIN) 25 MG tablet Take 1 tablet (25 mg total) by mouth daily.   valsartan (DIOVAN) 80 MG tablet Take 1 tablet (80 mg total) by mouth daily.   No facility-administered medications prior to visit.    Review of Systems  Constitutional: Negative.   Respiratory: Negative.    Cardiovascular: Negative.   Gastrointestinal: Negative.   Neurological:  Positive for light-headedness. Negative for dizziness, seizures, syncope and headaches.      Objective    BP (!) 140/59 (BP Location: Right Arm, Patient Position: Sitting, Cuff Size: Large)    Pulse 63    Temp 98.5 F (36.9 C) (Oral)    Wt 164 lb (74.4 kg)    SpO2 100%    BMI 28.15 kg/m    Physical Exam   General: Appearance:     Well developed, well nourished female in no acute distress  Eyes:  PERRL, conjunctiva/corneas clear, EOM's intact       Lungs:     Clear to auscultation bilaterally, respirations unlabored  Heart:    Normal heart rate. Normal rhythm. No murmurs, rubs, or gallops.    MS:   All extremities are intact.    Neurologic:   Awake, alert, oriented x 3. No apparent focal neurological defect.          Assessment & Plan     1. Bradycardia Normal rate since stopping atenolol. Has follow up scheduled with Dr. Ubaldo Glassing to go over Holter Monitory  2. Benign hypertension with chronic kidney disease, stage III (HCC) SBP borderline high but  stable since stopping atenolol. Continue current dose of hctz for the time being.   3. Stage 3a chronic kidney disease ([place]) Will recheck labs at follow up for diabetes in 3-4 months.       The entirety of the information documented in the History of Present Illness, Review of Systems and Physical Exam were personally obtained by me. Portions of this information were initially documented by the CMA and reviewed by me for thoroughness and accuracy.     Lelon Huh, MD  [facility] [phone] (phone) [phone] (fax)  [place]

## 2021-04-16 ENCOUNTER — Other Ambulatory Visit: Payer: Self-pay

## 2021-04-16 ENCOUNTER — Ambulatory Visit
Admission: RE | Admit: 2021-04-16 | Discharge: 2021-04-16 | Disposition: A | Discharge status: Home / Self Care | Payer: PPO | Source: Ambulatory Visit | Attending: Family Medicine | Admitting: Family Medicine

## 2021-04-16 DIAGNOSIS — E2839 Other primary ovarian failure: Secondary | ICD-10-CM | POA: Diagnosis not present

## 2021-04-16 DIAGNOSIS — Z780 Asymptomatic menopausal state: Secondary | ICD-10-CM | POA: Diagnosis not present

## 2021-04-17 DIAGNOSIS — R001 Bradycardia, unspecified: Secondary | ICD-10-CM | POA: Diagnosis not present

## 2021-04-17 DIAGNOSIS — I10 Essential (primary) hypertension: Secondary | ICD-10-CM | POA: Diagnosis not present

## 2021-07-15 ENCOUNTER — Ambulatory Visit (INDEPENDENT_AMBULATORY_CARE_PROVIDER_SITE_OTHER): Payer: PPO

## 2021-07-15 VITALS — Wt 164.0 lb

## 2021-07-15 DIAGNOSIS — Z0000 Encounter for general adult medical examination without abnormal findings: Secondary | ICD-10-CM | POA: Diagnosis not present

## 2021-07-15 NOTE — Patient Instructions (Signed)
Tracie Gray , ?Thank you for taking time to come for your Medicare Wellness Visit. I appreciate your ongoing commitment to your health goals. Please review the following plan we discussed and let me know if I can assist you in the future.  ? ?Screening recommendations/referrals: ?Colonoscopy: aged out ?Mammogram: aged out ?Bone Density: 04/16/21 ?Recommended yearly ophthalmology/optometry visit for glaucoma screening and checkup ?Recommended yearly dental visit for hygiene and checkup ? ?Vaccinations: ?Influenza vaccine: n/d ?Pneumococcal vaccine: 01/31/15 ?Tdap vaccine: 11/04/11 ?Shingles vaccine: Shingrix 04/21/18, 01/24/19   ?Covid-19:n/d ? ?Advanced directives: no ? ?Conditions/risks identified: none ? ?Next appointment: Follow up in one year for your annual wellness visit 07/20/22 @ 8:45am by phone ? ? ?Preventive Care 39 Years and Older, Female ?Preventive care refers to lifestyle choices and visits with your health care provider that can promote health and wellness. ?What does preventive care include? ?A yearly physical exam. This is also called an annual well check. ?Dental exams once or twice a year. ?Routine eye exams. Ask your health care provider how often you should have your eyes checked. ?Personal lifestyle choices, including: ?Daily care of your teeth and gums. ?Regular physical activity. ?Eating a healthy diet. ?Avoiding tobacco and drug use. ?Limiting alcohol use. ?Practicing safe sex. ?Taking low-dose aspirin every day. ?Taking vitamin and mineral supplements as recommended by your health care provider. ?What happens during an annual well check? ?The services and screenings done by your health care provider during your annual well check will depend on your age, overall health, lifestyle risk factors, and family history of disease. ?Counseling  ?Your health care provider may ask you questions about your: ?Alcohol use. ?Tobacco use. ?Drug use. ?Emotional well-being. ?Home and relationship  well-being. ?Sexual activity. ?Eating habits. ?History of falls. ?Memory and ability to understand (cognition). ?Work and work Statistician. ?Reproductive health. ?Screening  ?You may have the following tests or measurements: ?Height, weight, and BMI. ?Blood pressure. ?Lipid and cholesterol levels. These may be checked every 5 years, or more frequently if you are over 50 years old. ?Skin check. ?Lung cancer screening. You may have this screening every year starting at age 71 if you have a 30-pack-year history of smoking and currently smoke or have quit within the past 15 years. ?Fecal occult blood test (FOBT) of the stool. You may have this test every year starting at age 56. ?Flexible sigmoidoscopy or colonoscopy. You may have a sigmoidoscopy every 5 years or a colonoscopy every 10 years starting at age 51. ?Hepatitis C blood test. ?Hepatitis B blood test. ?Sexually transmitted disease (STD) testing. ?Diabetes screening. This is done by checking your blood sugar (glucose) after you have not eaten for a while (fasting). You may have this done every 1-3 years. ?Bone density scan. This is done to screen for osteoporosis. You may have this done starting at age 51. ?Mammogram. This may be done every 1-2 years. Talk to your health care provider about how often you should have regular mammograms. ?Talk with your health care provider about your test results, treatment options, and if necessary, the need for more tests. ?Vaccines  ?Your health care provider may recommend certain vaccines, such as: ?Influenza vaccine. This is recommended every year. ?Tetanus, diphtheria, and acellular pertussis (Tdap, Td) vaccine. You may need a Td booster every 10 years. ?Zoster vaccine. You may need this after age 25. ?Pneumococcal 13-valent conjugate (PCV13) vaccine. One dose is recommended after age 67. ?Pneumococcal polysaccharide (PPSV23) vaccine. One dose is recommended after age 6. ?Talk to your health  care provider about which  screenings and vaccines you need and how often you need them. ?This information is not intended to replace advice given to you by your health care provider. Make sure you discuss any questions you have with your health care provider. ?Document Released: 04/05/2015 Document Revised: 11/27/2015 Document Reviewed: 01/08/2015 ?Elsevier Interactive Patient Education ? 2017 [place]. ? ?Fall Prevention in the Home ?Falls can cause injuries. They can happen to people of all ages. There are many things you can do to make your home safe and to help prevent falls. ?What can I do on the outside of my home? ?Regularly fix the edges of walkways and driveways and fix any cracks. ?Remove anything that might make you trip as you walk through a door, such as a raised step or threshold. ?Trim any bushes or trees on the path to your home. ?Use bright outdoor lighting. ?Clear any walking paths of anything that might make someone trip, such as rocks or tools. ?Regularly check to see if handrails are loose or broken. Make sure that both sides of any steps have handrails. ?Any raised decks and porches should have guardrails on the edges. ?Have any leaves, snow, or ice cleared regularly. ?Use sand or salt on walking paths during winter. ?Clean up any spills in your garage right away. This includes oil or grease spills. ?What can I do in the bathroom? ?Use night lights. ?Install grab bars by the toilet and in the tub and shower. Do not use towel bars as grab bars. ?Use non-skid mats or decals in the tub or shower. ?If you need to sit down in the shower, use a plastic, non-slip stool. ?Keep the floor dry. Clean up any water that spills on the floor as soon as it happens. ?Remove soap buildup in the tub or shower regularly. ?Attach bath mats securely with double-sided non-slip rug tape. ?Do not have throw rugs and other things on the floor that can make you trip. ?What can I do in the bedroom? ?Use night lights. ?Make sure that you have a  light by your bed that is easy to reach. ?Do not use any sheets or blankets that are too big for your bed. They should not hang down onto the floor. ?Have a firm chair that has side arms. You can use this for support while you get dressed. ?Do not have throw rugs and other things on the floor that can make you trip. ?What can I do in the kitchen? ?Clean up any spills right away. ?Avoid walking on wet floors. ?Keep items that you use a lot in easy-to-reach places. ?If you need to reach something above you, use a strong step stool that has a grab bar. ?Keep electrical cords out of the way. ?Do not use floor polish or wax that makes floors slippery. If you must use wax, use non-skid floor wax. ?Do not have throw rugs and other things on the floor that can make you trip. ?What can I do with my stairs? ?Do not leave any items on the stairs. ?Make sure that there are handrails on both sides of the stairs and use them. Fix handrails that are broken or loose. Make sure that handrails are as long as the stairways. ?Check any carpeting to make sure that it is firmly attached to the stairs. Fix any carpet that is loose or worn. ?Avoid having throw rugs at the top or bottom of the stairs. If you do have throw rugs, attach them to  the floor with carpet tape. ?Make sure that you have a light switch at the top of the stairs and the bottom of the stairs. If you do not have them, ask someone to add them for you. ?What else can I do to help prevent falls? ?Wear shoes that: ?Do not have high heels. ?Have rubber bottoms. ?Are comfortable and fit you well. ?Are closed at the toe. Do not wear sandals. ?If you use a stepladder: ?Make sure that it is fully opened. Do not climb a closed stepladder. ?Make sure that both sides of the stepladder are locked into place. ?Ask someone to hold it for you, if possible. ?Clearly mark and make sure that you can see: ?Any grab bars or handrails. ?First and last steps. ?Where the edge of each step  is. ?Use tools that help you move around (mobility aids) if they are needed. These include: ?Canes. ?Walkers. ?Scooters. ?Crutches. ?Turn on the lights when you go into a dark area. Replace any light bulbs as soon as they

## 2021-07-15 NOTE — Progress Notes (Signed)
?Virtual Visit via Telephone Note ? ?I connected with  Tracie Gray on 07/15/21 at  9:00 AM EDT by telephone and verified that I am speaking with the correct person using two identifiers. ? ?Location: ?Patient: home ?Provider: [facility] ?Persons participating in the virtual visit: patient/Nurse Health Advisor ?  ?I discussed the limitations, risks, security and privacy concerns of performing an evaluation and management service by telephone and the availability of in person appointments. The patient expressed understanding and agreed to proceed. ? ?Interactive audio and video telecommunications were attempted between this nurse and patient, however failed, due to patient having technical difficulties OR patient did not have access to video capability.  We continued and completed visit with audio only. ? ?Some vital signs may be absent or patient reported.  ? ?Dionisio David, LPN ? ?Subjective:  ? Tracie Gray is a 81 y.o. female who presents for Medicare Annual (Subsequent) preventive examination. ? ?Review of Systems    ? ?  ? ?   ?Objective:  ?  ?There were no vitals filed for this visit. ?There is no height or weight on file to calculate BMI. ? ? ?  03/13/2019  ? 10:13 AM 03/08/2018  ? 10:42 AM 03/03/2017  ?  2:07 PM 01/31/2015  ? 10:05 AM  ?Advanced Directives  ?Does Patient Have a Medical Advance Directive? Yes Yes Yes Yes  ?Type of Paramedic of [place];Living will [place];Living will Living will;Healthcare Power of [place];Living will  ?Copy of [place] in Chart? No - copy requested No - copy requested No - copy requested   ? ? ?Current Medications (verified) ?Outpatient Encounter Medications as of 07/15/2021  ?Medication Sig  ? aspirin 81 MG tablet Take 81 mg by mouth daily.  ? atenolol (TENORMIN) 25 MG tablet Take 1 tablet (25 mg total) by mouth daily.  ? Cholecalciferol (VITAMIN D-3) 5000 UNITS TABS  Take 1 tablet by mouth every 7 (seven) days.  ? clobetasol cream (TEMOVATE) 7.98 % Apply 1 application topically 2 (two) times daily. PRN  ? hydrochlorothiazide (HYDRODIURIL) 25 MG tablet Take 1 tablet (25 mg total) by mouth daily.  ? ONETOUCH ULTRA test strip USE AS DIRECTED TO CHECK BLOOD SUGAR TWICE DAILY  ? valsartan (DIOVAN) 160 MG tablet Take 1 tablet (160 mg total) by mouth daily.  ? valsartan (DIOVAN) 80 MG tablet Take 1 tablet (80 mg total) by mouth daily.  ? ?No facility-administered encounter medications on file as of 07/15/2021.  ? ? ?Allergies (verified) ?Calcium channel blockers, Norvasc [amlodipine besylate], and Penicillins  ? ?History: ?Past Medical History:  ?Diagnosis Date  ? Arthritic-like pain   ? Bilateral cataracts   ? Bullous pemphigoid   ? reaction with ca channel blockers and pcn  ? Hypertension   ? Personal history of colonic polyps-adenomas 12/03/2011  ? Thyroid disease   ? ?Past Surgical History:  ?Procedure Laterality Date  ? ABDOMINAL HYSTERECTOMY  1981  ? CATARACT EXTRACTION Bilateral 2003  ? [place]  ? CHOLECYSTECTOMY  1982  ? RETINOPATHY SURGERY Right   ? ?Family History  ?Problem Relation Age of Onset  ? Cancer Mother   ? Coronary artery disease Father   ? Heart disease Father   ? Heart attack Father   ? Heart attack Paternal Grandfather   ? Colon cancer Neg Hx   ? Rectal cancer Neg Hx   ? Stomach cancer Neg Hx   ?  Esophageal cancer Neg Hx   ? ?Social History  ? ?Socioeconomic History  ? Marital status: Married  ?  Spouse name: Fritz Pickerel  ? Number of children: 2  ? Years of education: Not on file  ? Highest education level: Some college, no degree  ?Occupational History  ? Occupation: retired  ?Tobacco Use  ? Smoking status: Former  ?  Packs/day: 1.00  ?  Years: 1.00  ?  Pack years: 1.00  ?  Types: Cigarettes  ?  Quit date: 03/23/1962  ?  Years since quitting: 59.3  ? Smokeless tobacco: Never  ?Vaping Use  ? Vaping Use: Never used  ?Substance and Sexual Activity  ? Alcohol  use: No  ? Drug use: No  ? Sexual activity: Not on file  ?Other Topics Concern  ? Not on file  ?Social History Narrative  ? 2 biological and 1 adopted   ? ?Social Determinants of Health  ? ?Financial Resource Strain: Not on file  ?Food Insecurity: Not on file  ?Transportation Needs: Not on file  ?Physical Activity: Not on file  ?Stress: Not on file  ?Social Connections: Not on file  ? ? ?Tobacco Counseling ?Counseling given: Not Answered ? ? ?Clinical Intake: ? ?Pre-visit preparation completed: Yes ? ?Pain : No/denies pain ? ?  ? ?Nutritional Risks: None ?Diabetes: Yes ?CBG done?: No ?Did pt. bring in CBG monitor from home?: No ? ?How often do you need to have someone help you when you read instructions, pamphlets, or other written materials from your doctor or pharmacy?: 1 - Never ? ?Diabetic?yes ?Nutrition Risk Assessment: ? ?Has the patient had any N/V/D within the last 2 months?  No  ?Does the patient have any non-healing wounds?  No  ?Has the patient had any unintentional weight loss or weight gain?  No  ? ?Diabetes: ? ?Is the patient diabetic?  Yes  ?If diabetic, was a CBG obtained today?  No  ?Did the patient bring in their glucometer from home?  No  ?How often do you monitor your CBG's? occasionally.  ? ?Financial Strains and Diabetes Management: ? ?Are you having any financial strains with the device, your supplies or your medication? No .  ?Does the patient want to be seen by Chronic Care Management for management of their diabetes?  No  ?Would the patient like to be referred to a Nutritionist or for Diabetic Management?  No  ? ?Diabetic Exams: ? ?Diabetic Eye Exam: Completed 04/26/15. Overdue for diabetic eye exam. Pt has been advised about the importance in completing this exam. ? ?Diabetic Foot Exam: Completed 02/09/17. Pt has been advised about the importance in completing this exam.  ? ? ?Interpreter Needed?: No ? ?Information entered by :: Kirke Shaggy, LPN ? ? ?Activities of Daily Living ? ?   02/24/2021  ?  8:51 AM  ?In your present state of health, do you have any difficulty performing the following activities:  ?Hearing? 0  ?Vision? 0  ?Difficulty concentrating or making decisions? 0  ?Walking or climbing stairs? 1  ?Dressing or bathing? 0  ?Doing errands, shopping? 0  ? ? ?Patient Care Team: ?Birdie Sons, MD as PCP - General (Family Medicine) ?Teodoro Spray, MD as Consulting Physician (Cardiology) ?Gatha Mayer, MD as Consulting Physician (Gastroenterology) ? ?Indicate any recent Medical Services you may have received from other than [facility] providers in the past year (date may be approximate). ? ?   ?Assessment:  ? This is a routine wellness examination for  Tracie Gray. ? ?Hearing/Vision screen ?No results found. ? ?Dietary issues and exercise activities discussed: ?  ? ? Goals Addressed   ?None ?  ? ?Depression Screen ? ?  02/24/2021  ?  8:50 AM 03/13/2019  ? 10:38 AM 03/13/2019  ? 10:25 AM 04/20/2018  ? 10:11 AM 03/08/2018  ? 10:42 AM 03/03/2017  ?  2:08 PM 02/09/2017  ?  9:47 AM  ?PHQ 2/9 Scores  ?PHQ - 2 Score 0 0 0 0 0 0 0  ?PHQ- 9 Score 1     1 0  ?  ?Fall Risk ? ?  02/24/2021  ?  8:51 AM 10/17/2019  ?  2:25 PM 03/13/2019  ? 10:37 AM 04/20/2018  ? 10:11 AM 03/08/2018  ? 10:42 AM  ?Fall Risk   ?Falls in the past year? 0 0 0 0 0  ?Comment  E[place]i Telephone Survey: data to providers prior to load     ?Number falls in past yr: 0  0  0  ?Injury with Fall? 0  0  0  ? ? ?FALL RISK PREVENTION PERTAINING TO THE HOME: ? ?Any stairs in or around the home? No  ?If so, are there any without handrails? No  ?Home free of loose throw rugs in walkways, pet beds, electrical cords, etc? Yes  ?Adequate lighting in your home to reduce risk of falls? Yes  ? ?ASSISTIVE DEVICES UTILIZED TO PREVENT FALLS: ? ?Life alert? No  ?Use of a cane, walker or w/c? Yes  ?Grab bars in the bathroom? Yes  ?Shower chair or bench in shower? Yes  ?Elevated toilet seat or a handicapped toilet? Yes  ? ?Cognitive Function: ?  ?  ?   ? ?Immunizations ?Immunization History  ?Administered Date(s) Administered  ? Influenza, High Dose Seasonal PF 02/05/2016, 02/09/2017, 03/08/2018  ? Influenza,inj,Quad PF,6+ Mos 01/31/2015  ? Pneumococcal Conjugate-13 11/1

## 2021-07-21 ENCOUNTER — Ambulatory Visit (INDEPENDENT_AMBULATORY_CARE_PROVIDER_SITE_OTHER): Payer: PPO | Admitting: Family Medicine

## 2021-07-21 ENCOUNTER — Encounter: Payer: Self-pay | Admitting: Family Medicine

## 2021-07-21 VITALS — BP 139/61 | HR 59 | Temp 98.1°F | Resp 14 | Wt 164.5 lb

## 2021-07-21 DIAGNOSIS — N1831 Chronic kidney disease, stage 3a: Secondary | ICD-10-CM

## 2021-07-21 DIAGNOSIS — E559 Vitamin D deficiency, unspecified: Secondary | ICD-10-CM

## 2021-07-21 DIAGNOSIS — E1122 Type 2 diabetes mellitus with diabetic chronic kidney disease: Secondary | ICD-10-CM | POA: Diagnosis not present

## 2021-07-21 NOTE — Progress Notes (Signed)
?  ? ?I,Roshena L Chambers,acting as a scribe for Lelon Huh, MD.,have documented all relevant documentation on the behalf of Lelon Huh, MD,as directed by  Lelon Huh, MD while in the presence of Lelon Huh, MD.  ? ? ?Established patient visit ? ? ?Patient: Tracie Gray   DOB: June 18, 1940   81 y.o. Female  MRN: [id number] ?Visit Date: 07/21/2021 ? ?Today's healthcare provider: Lelon Huh, MD  ? ?Chief Complaint  ?Patient presents with  ? Diabetes  ? Hypertension  ? ?Subjective  ?  ?HPI  ?Diabetes Mellitus Type II, Follow-up ? ?Lab Results  ?Component Value Date  ? HGBA1C 6.7 (A) 02/24/2021  ? HGBA1C 6.4 (H) 08/15/2019  ? HGBA1C 6.5 (A) 04/20/2018  ? ?Wt Readings from Last 3 Encounters:  ?07/21/21 164 lb 8 oz (74.6 kg)  ?07/15/21 164 lb (74.4 kg)  ?04/11/21 164 lb (74.4 kg)  ? ?Last seen for diabetes 6 months ago.  ?Management since then includes: no changes  ?She reports good compliance with treatment. ?She is not having side effects.  ? ?Home blood sugar records:  blood sugars are not checked regularly ? ?Episodes of hypoglycemia? No  ?  ?Current insulin regiment: none ?Most Recent Eye Exam: >1 year ago ?Current exercise: none ?Current diet habits: in general, a "healthy" diet   ? ?Pertinent Labs: ?Lab Results  ?Component Value Date  ? CHOL 205 (H) 02/24/2021  ? HDL 48 02/24/2021  ? LDLCALC 132 (H) 02/24/2021  ? TRIG 142 02/24/2021  ? CHOLHDL 4.3 02/24/2021  ? Lab Results  ?Component Value Date  ? NA 145 (H) 02/24/2021  ? K 4.2 02/24/2021  ? CREATININE 1.21 (H) 02/24/2021  ? EGFR 45 (L) 02/24/2021  ? MICROALBUR negative 02/09/2017  ? LABMICR 12.9 08/15/2019  ?  ? ?---------------------------------------------------------------------------------------------------  ?Hypertension, follow-up ? ?BP Readings from Last 3 Encounters:  ?07/21/21 139/61  ?04/11/21 (!) 140/59  ?02/24/21 (!) 148/62  ? Wt Readings from Last 3 Encounters:  ?07/21/21 164 lb 8 oz (74.6 kg)  ?07/15/21 164 lb (74.4 kg)   ?04/11/21 164 lb (74.4 kg)  ?  ? ?She was last seen for hypertension 4 months ago.  ?BP at that visit was 140/59. Management since that visit includes Continue current dose of hctz for the time being.  ?She reports good compliance with treatment. ?She is not having side effects.  ?She is following a Regular diet. ?She is not exercising. ?She does not smoke. ? ?Use of agents associated with hypertension: NSAIDS.  ? ?Outside blood pressures are not checked. ?Symptoms: ?No chest pain No chest pressure  ?No palpitations No syncope  ?No dyspnea No orthopnea  ?No paroxysmal nocturnal dyspnea No lower extremity edema  ? ? ?Medications: ?Outpatient Medications Prior to Visit  ?Medication Sig  ? aspirin 81 MG tablet Take 81 mg by mouth daily.  ? Cholecalciferol (VITAMIN D-3) 5000 UNITS TABS Take 1 tablet by mouth every 7 (seven) days.  ? hydrochlorothiazide (HYDRODIURIL) 25 MG tablet Take 1 tablet (25 mg total) by mouth daily.  ? ONETOUCH ULTRA test strip USE AS DIRECTED TO CHECK BLOOD SUGAR TWICE DAILY  ? valsartan (DIOVAN) 160 MG tablet Take 1 tablet (160 mg total) by mouth daily.  ? atenolol (TENORMIN) 25 MG tablet Take 1 tablet (25 mg total) by mouth daily. (Patient not taking: Reported on 07/15/2021)  ? [DISCONTINUED] clobetasol cream (TEMOVATE) 4.09 % Apply 1 application topically 2 (two) times daily. PRN (Patient not taking: Reported on 07/21/2021)  ? [DISCONTINUED] valsartan (DIOVAN)  80 MG tablet Take 1 tablet (80 mg total) by mouth daily. (Patient not taking: Reported on 07/21/2021)  ? ?No facility-administered medications prior to visit.  ? ? ?Review of Systems  ?Constitutional:  Negative for appetite change, chills, fatigue and fever.  ?Respiratory:  Negative for chest tightness and shortness of breath.   ?Cardiovascular:  Negative for chest pain and palpitations.  ?Gastrointestinal:  Negative for abdominal pain, nausea and vomiting.  ?Neurological:  Negative for dizziness and weakness.  ? ? ?  Objective  ?  ?BP  139/61 (BP Location: Left Arm, Patient Position: Sitting, Cuff Size: Large)   Pulse (!) 59   Temp 98.1 ?F (36.7 ?C) (Oral)   Resp 14   Wt 164 lb 8 oz (74.6 kg)   SpO2 99% Comment: room air  BMI 28.24 kg/m?  ? ? ?Physical Exam  ? ?General: Appearance:     ?Overweight female in no acute distress  ?Eyes:    PERRL, conjunctiva/corneas clear, EOM's intact       ?Lungs:     Clear to auscultation bilaterally, respirations unlabored  ?Heart:    Bradycardic. Normal rhythm. No murmurs, rubs, or gallops.    ?MS:   All extremities are intact.    ?Neurologic:   Awake, alert, oriented x 3. No apparent focal neurological defect.   ?   ?  ? Assessment & Plan  ?  ? ?1. Type 2 diabetes mellitus with stage 3a chronic kidney disease, without long-term current use of insulin ([place]) ? ?- Urine Albumin-Creatinine with uACR ?- Hemoglobin A1c ?- Renal function panel ? ?2. Stage 3a chronic kidney disease ([place]) ? ?- VITAMIN D 25 Hydroxy (Vit-D Deficiency, Fractures) ? ?3. Vitamin D deficiency ? ?- VITAMIN D 25 Hydroxy (Vit-D Deficiency, Fractures)  ?   ? ?The entirety of the information documented in the History of Present Illness, Review of Systems and Physical Exam were personally obtained by me. Portions of this information were initially documented by the CMA and reviewed by me for thoroughness and accuracy.   ? ? ?Lelon Huh, MD  ?[facility] ?[phone] (phone) ?[phone] (fax) ? ?[facility] ?

## 2021-07-21 NOTE — Patient Instructions (Addendum)
Please review the attached list of medications and notify my office if there are any errors.   Please contact your eyecare professional to schedule a routine eye exam. I recommend seeing Dr. Keith Nice at [phone] or the [facility] at [phone]  

## 2021-07-22 LAB — RENAL FUNCTION PANEL
Albumin: 4.1 g/dL (ref 3.6–4.6)
BUN/Creatinine Ratio: 15 (ref 12–28)
BUN: 16 mg/dL (ref 8–27)
CO2: 28 mmol/L (ref 20–29)
Calcium: 9.8 mg/dL (ref 8.7–10.3)
Chloride: 102 mmol/L (ref 96–106)
Creatinine, Ser: 1.07 mg/dL — ABNORMAL HIGH (ref 0.57–1.00)
Glucose: 133 mg/dL — ABNORMAL HIGH (ref 70–99)
Phosphorus: 3.1 mg/dL (ref 3.0–4.3)
Potassium: 4.2 mmol/L (ref 3.5–5.2)
Sodium: 143 mmol/L (ref 134–144)
eGFR: 52 mL/min/{1.73_m2} — ABNORMAL LOW (ref >59)

## 2021-07-22 LAB — MICROALBUMIN / CREATININE URINE RATIO
Creatinine, Urine: 35.8 mg/dL
Microalb/Creat Ratio: 34 mg/g creat — ABNORMAL HIGH (ref 0–29)
Microalbumin, Urine: 12.3 ug/mL

## 2021-07-22 LAB — HEMOGLOBIN A1C
Est. average glucose Bld gHb Est-mCnc: 148 mg/dL
Hgb A1c MFr Bld: 6.8 % — ABNORMAL HIGH (ref 4.8–5.6)

## 2021-07-22 LAB — VITAMIN D 25 HYDROXY (VIT D DEFICIENCY, FRACTURES): Vit D, 25-Hydroxy: 31.5 ng/mL (ref 30.0–100.0)

## 2021-10-09 DIAGNOSIS — R001 Bradycardia, unspecified: Secondary | ICD-10-CM | POA: Diagnosis not present

## 2021-10-09 DIAGNOSIS — I10 Essential (primary) hypertension: Secondary | ICD-10-CM | POA: Diagnosis not present

## 2021-10-09 DIAGNOSIS — E119 Type 2 diabetes mellitus without complications: Secondary | ICD-10-CM | POA: Diagnosis not present

## 2021-10-30 NOTE — Progress Notes (Signed)
Established patient visit  I,[name],acting as a scribe for Lelon Huh, MD.,have documented all relevant documentation on the behalf of Lelon Huh, MD,as directed by  Lelon Huh, MD while in the presence of Lelon Huh, MD.   Patient: Tracie Gray   DOB: [birth year]   80 y.o. Female  MRN: [id number] Visit Date: 10/31/2021  Today's healthcare provider: Lelon Huh, MD   Chief Complaint  Patient presents with   Follow-up   Diabetes   Subjective    HPI  Diabetes Mellitus Type II, follow-up  Lab Results  Component Value Date   HGBA1C 6.6 (A) 10/31/2021   HGBA1C 6.8 (H) 07/21/2021   HGBA1C 6.7 (A) 02/24/2021   Last seen for diabetes 3 months ago.  Management since then includes continuing the same treatment. She reports fair compliance with treatment. She is not having side effects. none  Home blood sugar records: fasting range: not checking  Episodes of hypoglycemia? No none   Current insulin regiment: none Most Recent Eye Exam: sent request for most recent eye exam  --------------------------------------------------------------------------------------------------- Hypertension, follow-up  BP Readings from Last 3 Encounters:  10/31/21 136/73  07/21/21 139/61  04/11/21 (!) 140/59   Wt Readings from Last 3 Encounters:  10/31/21 162 lb (73.5 kg)  07/21/21 164 lb 8 oz (74.6 kg)  07/15/21 164 lb (74.4 kg)     She was last seen for hypertension 3 months ago.  BP at that visit was 139/61. Management since that visit includes no changes. She reports good compliance with treatment. She is not having side effects. none She is not exercising. She is adherent to low salt diet.   Outside blood pressures are not checking.  She does not smoke.  Use of agents associated with hypertension: none.   --------------------------------------------------------------------------------------------------- States she thinks she has irritable bowel  syndrome. States she occasionally gets some stomach cramps followed by frequent loose water stool. No blood in stool, but occasionally some mucous.  She also reports she occasionally feels like her left ear clogs up and had decreased hearing, but is never painful. Last several days, sometimes a few weeks.   Medications: Outpatient Medications Prior to Visit  Medication Sig   aspirin 81 MG tablet Take 81 mg by mouth daily.   Cholecalciferol (VITAMIN D-3) 5000 UNITS TABS Take 1 tablet by mouth every 7 (seven) days.   hydrochlorothiazide (HYDRODIURIL) 25 MG tablet Take 1 tablet (25 mg total) by mouth daily.   ONETOUCH ULTRA test strip USE AS DIRECTED TO CHECK BLOOD SUGAR TWICE DAILY   valsartan (DIOVAN) 160 MG tablet Take 1 tablet (160 mg total) by mouth daily.   No facility-administered medications prior to visit.        Objective    BP 136/73 (BP Location: Right Arm, Patient Position: Sitting, Cuff Size: Large)   Pulse (!) 58   Resp 16   Wt 162 lb (73.5 kg)   SpO2 97%   BMI 27.81 kg/m    Physical Exam    General: Appearance:    Well developed, well nourished female in no acute distress  ENT:   Right ear canal clear, normal TM, Left canal with excessive cerumen, but not quite obstricted  Eyes:    PERRL, conjunctiva/corneas clear, EOM's intact       Lungs:     Clear to auscultation bilaterally, respirations unlabored  Heart:    Bradycardic. Normal rhythm. No murmurs, rubs, or gallops.    MS:   All extremities are  intact.    Neurologic:   Awake, alert, oriented x 3. No apparent focal neurological defect.         Results for orders placed or performed in visit on 10/31/21  POCT glycosylated hemoglobin (Hb A1C)  Result Value Ref Range   Hemoglobin A1C 6.6 (A) 4.0 - 5.6 %   Est. average glucose Bld gHb Est-mCnc 143     Assessment & Plan     1. Type 2 diabetes mellitus with stage 3a chronic kidney disease, without long-term current use of insulin (HCC) Diet controlled.      2. Benign hypertension with chronic kidney disease, stage III (HCC) Well controlled.  Continue current medications.    3. Hyperlipidemia, unspecified hyperlipidemia type Diet controlled.   4. Irritable bowel syndrome, unspecified type Try  hyoscyamine (ANASPAZ) 0.125 MG TBDP disintergrating tablet; Place 1 tablet (0.125 mg total) under the tongue every 4 (four) hours as needed for cramping (or diarrha).  Dispense: 30 tablet; Refill: 1  5. Excessive cerumen in left ear canal Not obstructed today, she try OTC Debrox next time it feels clogged      The entirety of the information documented in the History of Present Illness, Review of Systems and Physical Exam were personally obtained by me. Portions of this information were initially documented by the CMA and reviewed by me for thoroughness and accuracy.     Lelon Huh, MD  [facility] [phone] (phone) [phone] (fax)  [place]

## 2021-10-31 ENCOUNTER — Encounter: Payer: Self-pay | Admitting: Family Medicine

## 2021-10-31 ENCOUNTER — Ambulatory Visit: Payer: Self-pay | Admitting: *Deleted

## 2021-10-31 ENCOUNTER — Ambulatory Visit (INDEPENDENT_AMBULATORY_CARE_PROVIDER_SITE_OTHER): Payer: PPO | Admitting: Family Medicine

## 2021-10-31 VITALS — BP 136/73 | HR 58 | Resp 16 | Wt 162.0 lb

## 2021-10-31 DIAGNOSIS — K589 Irritable bowel syndrome without diarrhea: Secondary | ICD-10-CM

## 2021-10-31 DIAGNOSIS — E1122 Type 2 diabetes mellitus with diabetic chronic kidney disease: Secondary | ICD-10-CM

## 2021-10-31 DIAGNOSIS — H6122 Impacted cerumen, left ear: Secondary | ICD-10-CM

## 2021-10-31 DIAGNOSIS — N1831 Chronic kidney disease, stage 3a: Secondary | ICD-10-CM | POA: Diagnosis not present

## 2021-10-31 DIAGNOSIS — N183 Chronic kidney disease, stage 3 unspecified: Secondary | ICD-10-CM

## 2021-10-31 DIAGNOSIS — E785 Hyperlipidemia, unspecified: Secondary | ICD-10-CM | POA: Diagnosis not present

## 2021-10-31 DIAGNOSIS — I129 Hypertensive chronic kidney disease with stage 1 through stage 4 chronic kidney disease, or unspecified chronic kidney disease: Secondary | ICD-10-CM | POA: Diagnosis not present

## 2021-10-31 LAB — POCT GLYCOSYLATED HEMOGLOBIN (HGB A1C)
Est. average glucose Bld gHb Est-mCnc: 143
Hemoglobin A1C: 6.6 % — AB (ref 4.0–5.6)

## 2021-10-31 MED ORDER — HYOSCYAMINE SULFATE 0.125 MG PO TBDP: 0.1250 mg | ORAL_TABLET | ORAL | 1 refills | Status: AC | PRN

## 2021-10-31 NOTE — Patient Instructions (Signed)
Please review the attached list of medications and notify my office if there are any errors.   Please bring all of your medications to every appointment so we can make sure that our medication list is the same as yours.   Try OTC Debrox drops next time you feel like your ear is clogged up

## 2021-10-31 NOTE — Telephone Encounter (Signed)
  Chief Complaint: diarrhea Symptoms: she thinks it is IBS Frequency: 3 days this time Pertinent Negatives: Patient denies fever, vomiting Disposition: '[]'$ ED /'[]'$ Urgent Care (no appt availability in office) / '[x]'$ Appointment(In office/virtual)/ '[]'$  [facility]/ '[]'$ Home Care/ '[]'$ Refused Recommended Disposition /'[]'$ [facility]/ '[]'$  Follow-up with PCP Additional Notes: Appt this morning with Dr. Caryn Section.   Reason for Disposition  [1] MODERATE diarrhea (e.g., 4-6 times / day more than normal) AND [2] age > 70 years  Answer Assessment - Initial Assessment Questions 1. DIARRHEA SEVERITY: "How bad is the diarrhea?" "How many more stools have you had in the past 24 hours than normal?"    - NO DIARRHEA (SCALE 0)   - MILD (SCALE 1-3): Few loose or mushy BMs; increase of 1-3 stools over normal daily number of stools; mild increase in ostomy output.   -  MODERATE (SCALE 4-7): Increase of 4-6 stools daily over normal; moderate increase in ostomy output.   -  SEVERE (SCALE 8-10; OR "WORST POSSIBLE"): Increase of 7 or more stools daily over normal; moderate increase in ostomy output; incontinence.     4 times a day when episodes start. Episodes come and go, thinks IBS 2. ONSET: "When did the diarrhea begin?"      Comes and goes this time started 3 days ago 3. BM CONSISTENCY: "How loose or watery is the diarrhea?"      Not watery, runny with some formed pieces that looked like phlegm, some times the color changes to bright yellow 4. VOMITING: "Are you also vomiting?" If Yes, ask: "How many times in the past 24 hours?"      no 5. ABDOMEN PAIN: "Are you having any abdomen pain?" If Yes, ask: "What does it feel like?" (e.g., crampy, dull, intermittent, constant)      cramps 6. ABDOMEN PAIN SEVERITY: If present, ask: "How bad is the pain?"  (e.g., Scale 1-10; mild, moderate, or severe)   - MILD (1-3): doesn't interfere with normal activities, abdomen soft and not tender to touch    - MODERATE  (4-7): interferes with normal activities or awakens from sleep, abdomen tender to touch    - SEVERE (8-10): excruciating pain, doubled over, unable to do any normal activities       5 7. ORAL INTAKE: If vomiting, "Have you been able to drink liquids?" "How much liquids have you had in the past 24 hours?"     No vomiting 8. HYDRATION: "Any signs of dehydration?" (e.g., dry mouth [not just dry lips], too weak to stand, dizziness, new weight loss) "When did you last urinate?"     Feels dehydrated at times  9. EXPOSURE: "Have you traveled to a foreign country recently?" "Have you been exposed to anyone with diarrhea?" "Could you have eaten any food that was spoiled?"     no 10. ANTIBIOTIC USE: "Are you taking antibiotics now or have you taken antibiotics in the past 2 months?"       no 11. OTHER SYMPTOMS: "Do you have any other symptoms?" (e.g., fever, blood in stool)       No fever, no blood 12. PREGNANCY: "Is there any chance you are pregnant?" "When was your last menstrual period?"       na  Protocols used: [place]

## 2022-02-26 NOTE — Progress Notes (Unsigned)
I,Tracie Gray,acting as a scribe for Tracie Huh, MD.,have documented all relevant documentation on the behalf of Tracie Huh, MD,as directed by  Tracie Huh, MD while in the presence of Tracie Huh, MD.   Established patient visit   Patient: Tracie Gray   DOB: November 14, 1940   81 y.o. Female  MRN: [id number] Visit Date: 03/02/2022  Today's healthcare provider: Lelon Huh, MD   Chief Complaint  Patient presents with   Follow-up   Hypertension   Diabetes   Hyperlipidemia   Subjective    HPI  Diabetes Mellitus Type II, follow-up  Lab Results  Component Value Date   HGBA1C 6.6 (A) 10/31/2021   HGBA1C 6.8 (H) 07/21/2021   HGBA1C 6.7 (A) 02/24/2021   Last seen for diabetes 3 months ago.  Management since then includes continuing the same treatment.  Home blood sugar records: fasting range: not checking Most Recent Eye Exam: due  --------------------------------------------------------------------------------------------------- Hypertension, follow-up  BP Readings from Last 3 Encounters:  03/02/22 (!) 154/63  10/31/21 136/73  07/21/21 139/61   Wt Readings from Last 3 Encounters:  03/02/22 162 lb (73.5 kg)  10/31/21 162 lb (73.5 kg)  07/21/21 164 lb 8 oz (74.6 kg)     She was last seen for hypertension 3 months ago.  Management since that visit includes; Well controlled.  Continue current medications.  .  Outside blood pressures are not checking.  --------------------------------------------------------------------------------------------------- Lipid/Cholesterol, follow-up  Last Lipid Panel: Lab Results  Component Value Date   CHOL 205 (H) 02/24/2021   LDLCALC 132 (H) 02/24/2021   HDL 48 02/24/2021   TRIG 142 02/24/2021    She was last seen for this 1 years ago.  Management since that visit includes; not taking anything.   Last metabolic panel Lab Results  Component Value Date   GLUCOSE 133 (H) 07/21/2021   NA 143 07/21/2021   K  4.2 07/21/2021   BUN 16 07/21/2021   CREATININE 1.07 (H) 07/21/2021   EGFR 52 (L) 07/21/2021   GFRNONAA 49 (L) 08/15/2019   CALCIUM 9.8 07/21/2021   AST 17 02/24/2021   ALT 13 02/24/2021   The ASCVD Risk score (Arnett DK, et al., 2019) failed to calculate for the following reasons:   The 2019 ASCVD risk score is only valid for ages 59 to 41  --------------------------------------------------------------------------------------------------- Abdominal pain Reports that she continues to have pain throughout her abdomen every time she eats. Was previously prescribed hyoscyamine which she has only taken a couple of times for diarrhea, but reports that episodes of diarrhea are now much less frequent. She is noted to have had multiple adenomatous polyps on colonoscopy in 2013 and she has delayed follow up screenings several times.   Medications: Outpatient Medications Prior to Visit  Medication Sig   aspirin 81 MG tablet Take 81 mg by mouth daily.   Cholecalciferol (VITAMIN D-3) 5000 UNITS TABS Take 1 tablet by mouth every 7 (seven) days.   hydrochlorothiazide (HYDRODIURIL) 25 MG tablet Take 1 tablet (25 mg total) by mouth daily.   hyoscyamine (ANASPAZ) 0.125 MG TBDP disintergrating tablet Place 1 tablet (0.125 mg total) under the tongue every 4 (four) hours as needed for cramping (or diarrha).   ONETOUCH ULTRA test strip USE AS DIRECTED TO CHECK BLOOD SUGAR TWICE DAILY   valsartan (DIOVAN) 160 MG tablet Take 1 tablet (160 mg total) by mouth daily.   No facility-administered medications prior to visit.    Review of Systems  Constitutional:  Negative for appetite  change, chills, fatigue and fever.  Respiratory:  Negative for chest tightness and shortness of breath.   Cardiovascular:  Negative for chest pain and palpitations.  Gastrointestinal:  Negative for abdominal pain, nausea and vomiting.  Neurological:  Negative for dizziness and weakness.       Objective    BP (!) 154/63 (BP  Location: Left Arm, Patient Position: Sitting, Cuff Size: Normal)   Pulse 60   Resp 16   Wt 162 lb (73.5 kg)   SpO2 98%   BMI 27.81 kg/m    Physical Exam  General appearance: Well developed, well nourished female, cooperative and in no acute distress Head: Normocephalic, without obvious abnormality, atraumatic Respiratory: Respirations even and unlabored, normal respiratory rate Extremities: All extremities are intact.  Skin: Skin color, texture, turgor normal. No rashes seen  Psych: Appropriate mood and affect. Neurologic: Mental status: Alert, oriented to person, place, and time, thought content appropriate.   Assessment & Plan     1. Benign hypertension with chronic kidney disease, stage III ([place]) Tolerating current medications well, but BP not at goal. Has had trouble with other BP medications in the past. Consider increasing hctz if electrolytes and eGFR are stable.  - hydrochlorothiazide (HYDRODIURIL) 25 MG tablet; Take 1 tablet (25 mg total) by mouth daily.  Dispense: 90 tablet; Refill: 3  2. Neuropathy   3. Stage 3a chronic kidney disease (HCC)  - Comprehensive metabolic panel  4. Vitamin D deficiency  - VITAMIN D 25 Hydroxy (Vit-D Deficiency, Fractures)  5. Hyperlipidemia, unspecified hyperlipidemia type Currently diet controlled.  - Lipid panel  6. Type 2 diabetes mellitus with stage 3a chronic kidney disease, without long-term current use of insulin (HCC)  - CBC  7. Abdominal pain, unspecified abdominal location  - Lipase  8. Bowel habit changes  Post prandial diarrhea has been less frequent lately.   9. Personal history of colonic polyps-adenomas Numerous excised in 2013 and patient has delayed follow up screening several times. Consider other GI symptoms I encouraged her to follow up with GI   - Ambulatory referral to Gastroenterology   Flu vaccine administered today.     The entirety of the information documented in the History of Present  Illness, Review of Systems and Physical Exam were personally obtained by me. Portions of this information were initially documented by the CMA and reviewed by me for thoroughness and accuracy.     Tracie Huh, MD  [facility] [phone] (phone) [phone] (fax)  [place]

## 2022-03-02 ENCOUNTER — Encounter: Payer: Self-pay | Admitting: Family Medicine

## 2022-03-02 ENCOUNTER — Ambulatory Visit (INDEPENDENT_AMBULATORY_CARE_PROVIDER_SITE_OTHER): Payer: PPO | Admitting: Family Medicine

## 2022-03-02 VITALS — BP 154/63 | HR 60 | Resp 16 | Wt 162.0 lb

## 2022-03-02 DIAGNOSIS — E785 Hyperlipidemia, unspecified: Secondary | ICD-10-CM | POA: Diagnosis not present

## 2022-03-02 DIAGNOSIS — G629 Polyneuropathy, unspecified: Secondary | ICD-10-CM

## 2022-03-02 DIAGNOSIS — E559 Vitamin D deficiency, unspecified: Secondary | ICD-10-CM

## 2022-03-02 DIAGNOSIS — Z86010 Personal history of colonic polyps: Secondary | ICD-10-CM

## 2022-03-02 DIAGNOSIS — Z23 Encounter for immunization: Secondary | ICD-10-CM | POA: Diagnosis not present

## 2022-03-02 DIAGNOSIS — R194 Change in bowel habit: Secondary | ICD-10-CM

## 2022-03-02 DIAGNOSIS — I129 Hypertensive chronic kidney disease with stage 1 through stage 4 chronic kidney disease, or unspecified chronic kidney disease: Secondary | ICD-10-CM | POA: Diagnosis not present

## 2022-03-02 DIAGNOSIS — N183 Chronic kidney disease, stage 3 unspecified: Secondary | ICD-10-CM

## 2022-03-02 DIAGNOSIS — N1831 Chronic kidney disease, stage 3a: Secondary | ICD-10-CM

## 2022-03-02 DIAGNOSIS — E1122 Type 2 diabetes mellitus with diabetic chronic kidney disease: Secondary | ICD-10-CM | POA: Diagnosis not present

## 2022-03-02 DIAGNOSIS — R109 Unspecified abdominal pain: Secondary | ICD-10-CM

## 2022-03-02 MED ORDER — HYDROCHLOROTHIAZIDE 25 MG PO TABS: 25.0000 mg | ORAL_TABLET | Freq: Every day | ORAL | 3 refills | Status: DC

## 2022-03-03 LAB — COMPREHENSIVE METABOLIC PANEL
ALT: 12 IU/L (ref 0–32)
AST: 18 IU/L (ref 0–40)
Albumin/Globulin Ratio: 1.3 (ref 1.2–2.2)
Albumin: 4 g/dL (ref 3.7–4.7)
Alkaline Phosphatase: 38 IU/L — ABNORMAL LOW (ref 44–121)
BUN/Creatinine Ratio: 12 (ref 12–28)
BUN: 13 mg/dL (ref 8–27)
Bilirubin Total: 0.6 mg/dL (ref 0.0–1.2)
CO2: 28 mmol/L (ref 20–29)
Calcium: 9.5 mg/dL (ref 8.7–10.3)
Chloride: 102 mmol/L (ref 96–106)
Creatinine, Ser: 1.07 mg/dL — ABNORMAL HIGH (ref 0.57–1.00)
Globulin, Total: 3.1 g/dL (ref 1.5–4.5)
Glucose: 133 mg/dL — ABNORMAL HIGH (ref 70–99)
Potassium: 3.5 mmol/L (ref 3.5–5.2)
Sodium: 142 mmol/L (ref 134–144)
Total Protein: 7.1 g/dL (ref 6.0–8.5)
eGFR: 52 mL/min/{1.73_m2} — ABNORMAL LOW (ref >59)

## 2022-03-03 LAB — LIPID PANEL
Chol/HDL Ratio: 3.7 ratio (ref 0.0–4.4)
Cholesterol, Total: 206 mg/dL — ABNORMAL HIGH (ref 100–199)
HDL: 56 mg/dL (ref >39)
LDL Chol Calc (NIH): 127 mg/dL — ABNORMAL HIGH (ref 0–99)
Triglycerides: 129 mg/dL (ref 0–149)
VLDL Cholesterol Cal: 23 mg/dL (ref 5–40)

## 2022-03-03 LAB — CBC
Hematocrit: 40.7 % (ref 34.0–46.6)
Hemoglobin: 13.6 g/dL (ref 11.1–15.9)
MCH: 32.2 pg (ref 26.6–33.0)
MCHC: 33.4 g/dL (ref 31.5–35.7)
MCV: 96 fL (ref 79–97)
Platelets: 256 10*3/uL (ref 150–450)
RBC: 4.22 x10E6/uL (ref 3.77–5.28)
RDW: 13.2 % (ref 11.7–15.4)
WBC: 7.4 10*3/uL (ref 3.4–10.8)

## 2022-03-03 LAB — VITAMIN D 25 HYDROXY (VIT D DEFICIENCY, FRACTURES): Vit D, 25-Hydroxy: 29.6 ng/mL — ABNORMAL LOW (ref 30.0–100.0)

## 2022-03-03 LAB — LIPASE: Lipase: 36 U/L (ref 14–85)

## 2022-03-05 LAB — HGB A1C W/O EAG: Hgb A1c MFr Bld: 6.9 % — ABNORMAL HIGH (ref 4.8–5.6)

## 2022-03-05 LAB — SPECIMEN STATUS REPORT

## 2022-04-09 ENCOUNTER — Telehealth: Payer: Self-pay | Admitting: Family Medicine

## 2022-04-09 DIAGNOSIS — I129 Hypertensive chronic kidney disease with stage 1 through stage 4 chronic kidney disease, or unspecified chronic kidney disease: Secondary | ICD-10-CM

## 2022-04-09 NOTE — Telephone Encounter (Signed)
Landon from [facility] called in states needs new script for Hydrochlorithyzide,25 mg because  she states pt says, dosage was increased from  1 to 2 pills a day.

## 2022-04-09 NOTE — Telephone Encounter (Signed)
She had 90 tablets dispensed on 12/27. She should have enough to get by till February. If BP is good will take care of this at her appointment on 1-31

## 2022-04-10 NOTE — Telephone Encounter (Signed)
Patient advised. Verbalized understanding 

## 2022-04-13 DIAGNOSIS — I10 Essential (primary) hypertension: Secondary | ICD-10-CM | POA: Diagnosis not present

## 2022-04-13 DIAGNOSIS — E119 Type 2 diabetes mellitus without complications: Secondary | ICD-10-CM | POA: Diagnosis not present

## 2022-04-13 DIAGNOSIS — R001 Bradycardia, unspecified: Secondary | ICD-10-CM | POA: Diagnosis not present

## 2022-04-22 ENCOUNTER — Encounter: Payer: Self-pay | Admitting: Family Medicine

## 2022-04-22 ENCOUNTER — Ambulatory Visit (INDEPENDENT_AMBULATORY_CARE_PROVIDER_SITE_OTHER): Payer: PPO | Admitting: Family Medicine

## 2022-04-22 VITALS — BP 139/64 | HR 69 | Ht 63.0 in | Wt 160.0 lb

## 2022-04-22 DIAGNOSIS — N183 Chronic kidney disease, stage 3 unspecified: Secondary | ICD-10-CM

## 2022-04-22 DIAGNOSIS — I129 Hypertensive chronic kidney disease with stage 1 through stage 4 chronic kidney disease, or unspecified chronic kidney disease: Secondary | ICD-10-CM

## 2022-04-22 NOTE — Patient Instructions (Addendum)
Please go to the lab draw station in [address] on the second floor of [facility]. Normal hours are 8:00am to 11:30am and 1:00pm to 4:00pm Monday through Friday

## 2022-04-22 NOTE — Progress Notes (Signed)
     I,Sha'taria Tyson,acting as a Education administrator for Lelon Huh, MD.,have documented all relevant documentation on the behalf of Lelon Huh, MD,as directed by  Lelon Huh, MD while in the presence of Lelon Huh, MD.   Established patient visit   Patient: Tracie Gray   DOB: 1940/05/06   82 y.o. Female  MRN: [id number] Visit Date: 04/22/2022  Today's healthcare provider: Lelon Huh, MD    Subjective    HPI   Hypertension, follow-up  BP Readings from Last 3 Encounters:  03/02/22 (!) 154/63  10/31/21 136/73  07/21/21 139/61   Wt Readings from Last 3 Encounters:  03/02/22 162 lb (73.5 kg)  10/31/21 162 lb (73.5 kg)  07/21/21 164 lb 8 oz (74.6 kg)     She was last seen for hypertension 7 weeks ago.  BP at that visit was 154/63. Management since that visit includes increase hctz to 2 x '25mg'$  tablets a day.  Outside blood pressures are rarely checked.  ---------------------------------------------------------------------------------------------------   Medications: Outpatient Medications Prior to Visit  Medication Sig   aspirin 81 MG tablet Take 81 mg by mouth daily.   Cholecalciferol (VITAMIN D-3) 5000 UNITS TABS Take 1 tablet by mouth every 7 (seven) days.   hydrochlorothiazide (HYDRODIURIL) 25 MG tablet Take 1 tablet (25 mg total) by mouth daily.   hyoscyamine (ANASPAZ) 0.125 MG TBDP disintergrating tablet Place 1 tablet (0.125 mg total) under the tongue every 4 (four) hours as needed for cramping (or diarrha).   ONETOUCH ULTRA test strip USE AS DIRECTED TO CHECK BLOOD SUGAR TWICE DAILY   valsartan (DIOVAN) 160 MG tablet Take 1 tablet (160 mg total) by mouth daily.   No facility-administered medications prior to visit.    Review of Systems  Constitutional:  Negative for appetite change, chills, fatigue and fever.  Respiratory:  Negative for chest tightness and shortness of breath.   Cardiovascular:  Negative for chest pain and palpitations.   Gastrointestinal:  Negative for abdominal pain, nausea and vomiting.  Neurological:  Negative for dizziness and weakness.       Objective    BP 139/64 (BP Location: Right Arm, Patient Position: Sitting, Cuff Size: Normal)   Pulse 69   Ht '5\' 3"'$  (1.6 m)   Wt 160 lb (72.6 kg)   SpO2 100%   BMI 28.34 kg/m    Physical Exam   General appearance: Well developed, well nourished female, cooperative and in no acute distress Head: Normocephalic, without obvious abnormality, atraumatic Respiratory: Respirations even and unlabored, normal respiratory rate Extremities: All extremities are intact.  Skin: Skin color, texture, turgor normal. No rashes seen  Psych: Appropriate mood and affect. Neurologic: Mental status: Alert, oriented to person, place, and time, thought content appropriate.    Assessment & Plan     1. Benign hypertension with chronic kidney disease, stage III ([place]) Doing well with increased dose of hctz. Continue current medications.   - Renal function panel   Follow up for diabetes in 4 months.       The entirety of the information documented in the History of Present Illness, Review of Systems and Physical Exam were personally obtained by me. Portions of this information were initially documented by the CMA and reviewed by me for thoroughness and accuracy.     Lelon Huh, MD  [place] [phone] (phone) [phone] (fax)  [employer]

## 2022-04-23 DIAGNOSIS — N183 Chronic kidney disease, stage 3 unspecified: Secondary | ICD-10-CM | POA: Diagnosis not present

## 2022-04-23 DIAGNOSIS — I129 Hypertensive chronic kidney disease with stage 1 through stage 4 chronic kidney disease, or unspecified chronic kidney disease: Secondary | ICD-10-CM | POA: Diagnosis not present

## 2022-04-24 LAB — RENAL FUNCTION PANEL
Albumin: 3.8 g/dL (ref 3.7–4.7)
BUN/Creatinine Ratio: 17 (ref 12–28)
BUN: 20 mg/dL (ref 8–27)
CO2: 26 mmol/L (ref 20–29)
Calcium: 9.7 mg/dL (ref 8.7–10.3)
Chloride: 98 mmol/L (ref 96–106)
Creatinine, Ser: 1.19 mg/dL — ABNORMAL HIGH (ref 0.57–1.00)
Glucose: 216 mg/dL — ABNORMAL HIGH (ref 70–99)
Phosphorus: 3 mg/dL (ref 3.0–4.3)
Potassium: 3.5 mmol/L (ref 3.5–5.2)
Sodium: 140 mmol/L (ref 134–144)
eGFR: 46 mL/min/{1.73_m2} — ABNORMAL LOW (ref >59)

## 2022-05-20 ENCOUNTER — Telehealth: Payer: Self-pay | Admitting: Family Medicine

## 2022-05-20 NOTE — Telephone Encounter (Signed)
Pts hydrochlorothiazide (HYDRODIURIL) 25 MG tablet  was increased to 2 tabs a day instead of 1/ so pt needs a new RX sent to the pharmacy since she has ran out of the original RX early / send to [place], [place] There computer system is down and the office will need to call the pharmacy

## 2022-05-21 ENCOUNTER — Other Ambulatory Visit: Payer: Self-pay

## 2022-05-21 DIAGNOSIS — I129 Hypertensive chronic kidney disease with stage 1 through stage 4 chronic kidney disease, or unspecified chronic kidney disease: Secondary | ICD-10-CM

## 2022-05-21 MED ORDER — HYDROCHLOROTHIAZIDE 25 MG PO TABS: 25.0000 mg | ORAL_TABLET | Freq: Every day | ORAL | 1 refills | Status: DC

## 2022-07-20 ENCOUNTER — Ambulatory Visit (INDEPENDENT_AMBULATORY_CARE_PROVIDER_SITE_OTHER): Payer: PPO

## 2022-07-20 VITALS — Ht 64.0 in | Wt 160.0 lb

## 2022-07-20 DIAGNOSIS — Z0000 Encounter for general adult medical examination without abnormal findings: Secondary | ICD-10-CM | POA: Diagnosis not present

## 2022-07-20 NOTE — Progress Notes (Signed)
I connected with  Tracie Gray on 07/20/22 by a audio enabled telemedicine application and verified that I am speaking with the correct person using two identifiers.  Patient Location: Home  Provider Location: Home Office  I discussed the limitations of evaluation and management by telemedicine. The patient expressed understanding and agreed to proceed.  Subjective:   Tracie Gray is a 82 y.o. female who presents for Medicare Annual (Subsequent) preventive examination.  Review of Systems    Cardiac Risk Factors include: advanced age (>25men, >36 women);diabetes mellitus;dyslipidemia;hypertension    Objective:    Today's Vitals   07/20/22 0845  Weight: 160 lb (72.6 kg)  Height: 5\' 4"  (1.626 m)   Body mass index is 27.46 kg/m.     07/20/2022    8:59 AM 07/15/2021    9:07 AM 03/13/2019   10:13 AM 03/08/2018   10:42 AM 03/03/2017    2:07 PM 01/31/2015   10:05 AM  Advanced Directives  Does Patient Have a Medical Advance Directive? Yes No Yes Yes Yes Yes  Type of Estate agent of [place];Living will  Healthcare Power of [place];Living will Healthcare Power of [place];Living will Living will;Healthcare Power of [employer] Power of [place];Living will  Copy of Healthcare Power of Attorney in Chart?   No - copy requested No - copy requested No - copy requested   Would patient like information on creating a medical advance directive?  No - Patient declined        Current Medications (verified) Outpatient Encounter Medications as of 07/20/2022  Medication Sig   aspirin 81 MG tablet Take 81 mg by mouth daily.   Cholecalciferol (VITAMIN D-3) 5000 UNITS TABS Take 1 tablet by mouth every 7 (seven) days.   hydrochlorothiazide (HYDRODIURIL) 25 MG tablet Take 1 tablet (25 mg total) by mouth daily. Take 2 tablets daily   hyoscyamine (ANASPAZ) 0.125 MG TBDP disintergrating tablet Place 1 tablet (0.125 mg total) under the tongue every 4 (four)  hours as needed for cramping (or diarrha).   ONETOUCH ULTRA test strip USE AS DIRECTED TO CHECK BLOOD SUGAR TWICE DAILY   valsartan (DIOVAN) 160 MG tablet Take 1 tablet (160 mg total) by mouth daily.   No facility-administered encounter medications on file as of 07/20/2022.    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   Hypertension    Personal history of colonic polyps-adenomas 12/03/2011   Thyroid disease    Past Surgical History:  Procedure Laterality Date   ABDOMINAL HYSTERECTOMY  1981   CATARACT EXTRACTION Bilateral 2003   CESAREAN SECTION  1981   CHOLECYSTECTOMY  1982   RETINOPATHY SURGERY Right    Family History  Problem Relation Age of Onset   Cancer Mother    Coronary artery disease Father    Heart disease Father    Heart attack Father    Heart attack Paternal Grandfather    Colon cancer Neg Hx    Rectal cancer Neg Hx    Stomach cancer Neg Hx    Esophageal cancer Neg Hx    Social History   Socioeconomic History   Marital status: Married    Spouse name: Tracie Gray   Number of children: 2   Years of education: Not on file   Highest education level: Some college,  no degree  Occupational History   Occupation: retired  Tobacco Use   Smoking status: Former    Packs/day: 1.00    Years: 1.00    Additional pack years: 0.00    Total pack years: 1.00    Types: Cigarettes    Quit date: 03/23/1962    Years since quitting: 60.3   Smokeless tobacco: Never  Vaping Use   Vaping Use: Never used  Substance and Sexual Activity   Alcohol use: No   Drug use: No   Sexual activity: Not on file  Other Topics Concern   Not on file  Social History Narrative   2 biological and 1 adopted    Social Determinants of Health   Financial Resource Strain: Low Risk   (07/20/2022)   Overall Financial Resource Strain (CARDIA)    Difficulty of Paying Living Expenses: Not hard at all  Food Insecurity: No Food Insecurity (07/20/2022)   Hunger Vital Sign    Worried About Running Out of Food in the Last Year: Never true    Ran Out of Food in the Last Year: Never true  Transportation Needs: No Transportation Needs (07/20/2022)   PRAPARE - Administrator, Civil Service (Medical): No    Lack of Transportation (Non-Medical): No  Physical Activity: Insufficiently Active (07/20/2022)   Exercise Vital Sign    Days of Exercise per Week: 3 days    Minutes of Exercise per Session: 30 min  Stress: No Stress Concern Present (07/20/2022)   [employer] of Occupational Health - Occupational Stress Questionnaire    Feeling of Stress : Not at all  Social Connections: Moderately Isolated (07/20/2022)   Social Connection and Isolation Panel [NHANES]    Frequency of Communication with Friends and Family: Twice a week    Frequency of Social Gatherings with Friends and Family: Once a week    Attends Religious Services: Never    Database administrator or Organizations: No    Attends Engineer, structural: Never    Marital Status: Married    Tobacco Counseling Counseling given: Not Answered   Clinical Intake:  Pre-visit preparation completed: Yes  Pain : No/denies pain   BMI - recorded: 27.46 Nutritional Status: BMI 25 -29 Overweight Nutritional Risks: None Diabetes: Yes CBG done?: No Did pt. bring in CBG monitor from home?: No  How often do you need to have someone help you when you read instructions, pamphlets, or other written materials from your doctor or pharmacy?: 1 - Never  Diabetic?yes  Interpreter Needed?: No  Comments: lives with husband Information entered by :: Tracie Griffee,LPN   Activities of Daily Living    07/20/2022    9:04 AM 04/22/2022   11:13 AM  In your present state of health, do you have any difficulty performing  the following activities:  Hearing? 0 0  Vision? 0 0  Difficulty concentrating or making decisions? 1 0  Comment memory not as well   Walking or climbing stairs? 1 1  Dressing or bathing? 0 0  Doing errands, shopping? 1 0  Comment husband does Chief Financial Officer and eating ? N   Using the Toilet? N   In the past six months, have you accidently leaked urine? N   Do you have problems with loss of bowel control? 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 N   Managing your Finances? N   Housekeeping or managing your Housekeeping? Jeannie Fend  Comment husband does     Patient Care Team: Malva Limes, MD as PCP - General (Family Medicine) Lady Gary Darlin Priestly, MD as Consulting Physician (Cardiology) Iva Boop, MD as Consulting Physician (Gastroenterology) Blair Promise, OD (Optometry)  Indicate any recent Medical Services you may have received from other than [facility] providers in the past year (date may be approximate).     Assessment:   This is a routine wellness examination for [place].  Hearing/Vision screen Hearing Screening - Comments:: Adequate hearing Vision Screening - Comments:: Adequate vision-readers only   Dietary issues and exercise activities discussed: Current Exercise Habits: Home exercise routine, Type of exercise: walking (yard work), Time (Minutes): 30, Frequency (Times/Week): 3, Weekly Exercise (Minutes/Week): 90, Intensity: Mild, Exercise limited by: neurologic condition(s)   Goals Addressed             This Visit's Progress    DIET - EAT MORE FRUITS AND VEGETABLES   On track    DIET - INCREASE WATER INTAKE   On track    Recommend increasing water intake to 4 glasses a day.      Exercise 150 minutes per week (moderate activity)   Not on track      Depression Screen    07/20/2022    8:56 AM 04/22/2022   11:13 AM 03/02/2022   10:17 AM 07/15/2021    9:06 AM 02/24/2021    8:50 AM 03/13/2019   10:38 AM  03/13/2019   10:25 AM  PHQ 2/9 Scores  PHQ - 2 Score 0 0 0 0 0 0 0  PHQ- 9 Score  2 1  1       Fall Risk    07/20/2022    8:53 AM 04/22/2022   11:13 AM 03/02/2022   10:17 AM 07/15/2021    9:09 AM 02/24/2021    8:51 AM  Fall Risk   Falls in the past year? 0 0 0 0 0  Number falls in past yr: 0 0 0 0 0  Injury with Fall? 0 0 0 0 0  Risk for fall due to : No Fall Risks No Fall Risks No Fall Risks No Fall Risks   Follow up Education provided;Falls prevention discussed Falls evaluation completed Falls evaluation completed Falls evaluation completed     FALL RISK PREVENTION PERTAINING TO THE HOME:  Any stairs in or around the home? Yes  If so, are there any without handrails? Yes  Home free of loose throw rugs in walkways, pet beds, electrical cords, etc? Yes  Adequate lighting in your home to reduce risk of falls? Yes   ASSISTIVE DEVICES UTILIZED TO PREVENT FALLS:  Life alert? No  Use of a cane, walker or w/c? Yes cane Grab bars in the bathroom? Yes  Shower chair or bench in shower? Yes  does not use  Elevated toilet seat or a handicapped toilet? Yes   Cognitive Function:        07/20/2022    9:10 AM 07/15/2021    9:12 AM  6CIT Screen  What Year? 0 points 0 points  What month? 0 points 0 points  What time? 0 points 0 points  Count back from 20 0 points 0 points  Months in reverse 0 points 0 points  Repeat phrase 0 points 0 points  Total Score 0 points 0 points    Immunizations Immunization History  Administered Date(s) Administered   Fluad Quad(high Dose 65+) 03/02/2022   Influenza, High Dose Seasonal PF 02/05/2016, 02/09/2017, 03/08/2018  Influenza,inj,Quad PF,6+ Mos 01/31/2015   Pneumococcal Conjugate-13 01/31/2015   Pneumococcal Polysaccharide-23 11/04/2011   Tdap 11/04/2011   Zoster Recombinat (Shingrix) 04/21/2018, 01/24/2019    TDAP status: Up to date  Flu Vaccine status: Up to date  Pneumococcal vaccine status: Up to date  Covid-19 vaccine status:  Declined, Education has been provided regarding the importance of this vaccine but patient still declined. Advised may receive this vaccine at local pharmacy or Health Dept.or vaccine clinic. Aware to provide a copy of the vaccination record if obtained from local pharmacy or Health Dept. Verbalized acceptance and understanding.  Qualifies for Shingles Vaccine? Yes   Zostavax completed Yes   Shingrix Completed?: Yes  Screening Tests Health Maintenance  Topic Date Due   COVID-19 Vaccine (1) Never done   COLONOSCOPY (Pts 45-1yrs Insurance coverage will need to be confirmed)  12/03/2014   OPHTHALMOLOGY EXAM  04/25/2016   DTaP/Tdap/Td (2 - Td or Tdap) 11/03/2021   Diabetic kidney evaluation - Urine ACR  07/22/2022   HEMOGLOBIN A1C  09/01/2022   INFLUENZA VACCINE  10/22/2022   Diabetic kidney evaluation - eGFR measurement  04/24/2023   Medicare Annual Wellness (AWV)  07/20/2023   DEXA SCAN  04/16/2026   Pneumonia Vaccine 65+ Years old  Completed   Zoster Vaccines- Shingrix  Completed   HPV VACCINES  Aged Out    Health Maintenance  Health Maintenance Due  Topic Date Due   COVID-19 Vaccine (1) Never done   COLONOSCOPY (Pts 45-62yrs Insurance coverage will need to be confirmed)  12/03/2014   OPHTHALMOLOGY EXAM  04/25/2016   DTaP/Tdap/Td (2 - Td or Tdap) 11/03/2021   Diabetic kidney evaluation - Urine ACR  07/22/2022    Colorectal cancer screening: No longer required.   Mammogram status: No longer required due to age.  Bone Density status: Completed yes. Results reflect: Bone density results: NORMAL. Repeat every 5 years.  Lung Cancer Screening: (Low Dose CT Chest recommended if Age 31-80 years, 30 pack-year currently smoking OR have quit w/in 15years.) does not qualify.   Lung Cancer Screening Referral: no  Additional Screening:  Hepatitis C Screening: does not qualify; Completed yes  Vision Screening: Recommended annual ophthalmology exams for early detection of glaucoma  and other disorders of the eye. Is the patient up to date with their annual eye exam?  No  Who is the provider or what is the name of the office in which the patient attends annual eye exams? Dr retired If pt is not established with a provider, would they like to be referred to a provider to establish care? No .   Dental Screening: Recommended annual dental exams for proper oral hygiene  Community Resource Referral / Chronic Care Management: CRR required this visit?  No   CCM required this visit?  No    Plan:     I have personally reviewed and noted the following in the patient's chart:   Medical and social history Use of alcohol, tobacco or illicit drugs  Current medications and supplements including opioid prescriptions. Patient is not currently taking opioid prescriptions. Functional ability and status Nutritional status Physical activity Advanced directives List of other physicians Hospitalizations, surgeries, and ER visits in previous 12 months Vitals Screenings to include cognitive, depression, and falls Referrals and appointments  In addition, I have reviewed and discussed with patient certain preventive protocols, quality metrics, and best practice recommendations. A written personalized care plan for preventive services as well as general preventive health recommendations were provided to patient.  Sue Lush, LPN   06/29/8117   Nurse Notes: Pt is doing well..says she does not go out to much:has continued to stay at home since Covid. Pt has no concerns or questions at this time.

## 2022-07-20 NOTE — Patient Instructions (Signed)
Tracie Gray , Thank you for taking time to come for your Medicare Wellness Visit. I appreciate your ongoing commitment to your health goals. Please review the following plan we discussed and let me know if I can assist you in the future.   These are the goals we discussed:  Goals      DIET - EAT MORE FRUITS AND VEGETABLES     DIET - INCREASE WATER INTAKE     Recommend increasing water intake to 4 glasses a day.      Exercise 150 minutes per week (moderate activity)        This is a list of the screening recommended for you and due dates:  Health Maintenance  Topic Date Due   COVID-19 Vaccine (1) Never done   Colon Cancer Screening  12/03/2014   Eye exam for diabetics  04/25/2016   DTaP/Tdap/Td vaccine (2 - Td or Tdap) 11/03/2021   Yearly kidney health urinalysis for diabetes  07/22/2022   Hemoglobin A1C  09/01/2022   Flu Shot  10/22/2022   Yearly kidney function blood test for diabetes  04/24/2023   Medicare Annual Wellness Visit  07/20/2023   DEXA scan (bone density measurement)  04/16/2026   Pneumonia Vaccine  Completed   Zoster (Shingles) Vaccine  Completed   HPV Vaccine  Aged Out    Advanced directives: yes  Conditions/risks identified: low falls risk  Next appointment: Follow up in one year for your annual wellness visit 07/21/2023 @8 :45am telephone   Preventive Care 65 Years and Older, Female Preventive care refers to lifestyle choices and visits with your health care provider that can promote health and wellness. What does preventive care include? A yearly physical exam. This is also called an annual well check. Dental exams once or twice a year. Routine eye exams. Ask your health care provider how often you should have your eyes checked. Personal lifestyle choices, including: Daily care of your teeth and gums. Regular physical activity. Eating a healthy diet. Avoiding tobacco and drug use. Limiting alcohol use. Practicing safe sex. Taking low-dose aspirin  every day. Taking vitamin and mineral supplements as recommended by your health care provider. What happens during an annual well check? The services and screenings done by your health care provider during your annual well check will depend on your age, overall health, lifestyle risk factors, and family history of disease. Counseling  Your health care provider may ask you questions about your: Alcohol use. Tobacco use. Drug use. Emotional well-being. Home and relationship well-being. Sexual activity. Eating habits. History of falls. Memory and ability to understand (cognition). Work and work Astronomer. Reproductive health. Screening  You may have the following tests or measurements: Height, weight, and BMI. Blood pressure. Lipid and cholesterol levels. These may be checked every 5 years, or more frequently if you are over 64 years old. Skin check. Lung cancer screening. You may have this screening every year starting at age 20 if you have a 30-pack-year history of smoking and currently smoke or have quit within the past 15 years. Fecal occult blood test (FOBT) of the stool. You may have this test every year starting at age 59. Flexible sigmoidoscopy or colonoscopy. You may have a sigmoidoscopy every 5 years or a colonoscopy every 10 years starting at age 67. Hepatitis C blood test. Hepatitis B blood test. Sexually transmitted disease (STD) testing. Diabetes screening. This is done by checking your blood sugar (glucose) after you have not eaten for a while (fasting). You may have  this done every 1-3 years. Bone density scan. This is done to screen for osteoporosis. You may have this done starting at age 37. Mammogram. This may be done every 1-2 years. Talk to your health care provider about how often you should have regular mammograms. Talk with your health care provider about your test results, treatment options, and if necessary, the need for more tests. Vaccines  Your health  care provider may recommend certain vaccines, such as: Influenza vaccine. This is recommended every year. Tetanus, diphtheria, and acellular pertussis (Tdap, Td) vaccine. You may need a Td booster every 10 years. Zoster vaccine. You may need this after age 73. Pneumococcal 13-valent conjugate (PCV13) vaccine. One dose is recommended after age 72. Pneumococcal polysaccharide (PPSV23) vaccine. One dose is recommended after age 64. Talk to your health care provider about which screenings and vaccines you need and how often you need them. This information is not intended to replace advice given to you by your health care provider. Make sure you discuss any questions you have with your health care provider. Document Released: 04/05/2015 Document Revised: 11/27/2015 Document Reviewed: 01/08/2015 Elsevier Interactive Patient Education  2017 [employer] Prevention in the Home Falls can cause injuries. They can happen to people of all ages. There are many things you can do to make your home safe and to help prevent falls. What can I do on the outside of my home? Regularly fix the edges of walkways and driveways and fix any cracks. Remove anything that might make you trip as you walk through a door, such as a raised step or threshold. Trim any bushes or trees on the path to your home. Use bright outdoor lighting. Clear any walking paths of anything that might make someone trip, such as rocks or tools. Regularly check to see if handrails are loose or broken. Make sure that both sides of any steps have handrails. Any raised decks and porches should have guardrails on the edges. Have any leaves, snow, or ice cleared regularly. Use sand or salt on walking paths during winter. Clean up any spills in your garage right away. This includes oil or grease spills. What can I do in the bathroom? Use night lights. Install grab bars by the toilet and in the tub and shower. Do not use towel bars as grab  bars. Use non-skid mats or decals in the tub or shower. If you need to sit down in the shower, use a plastic, non-slip stool. Keep the floor dry. Clean up any water that spills on the floor as soon as it happens. Remove soap buildup in the tub or shower regularly. Attach bath mats securely with double-sided non-slip rug tape. Do not have throw rugs and other things on the floor that can make you trip. What can I do in the bedroom? Use night lights. Make sure that you have a light by your bed that is easy to reach. Do not use any sheets or blankets that are too big for your bed. They should not hang down onto the floor. Have a firm chair that has side arms. You can use this for support while you get dressed. Do not have throw rugs and other things on the floor that can make you trip. What can I do in the kitchen? Clean up any spills right away. Avoid walking on wet floors. Keep items that you use a lot in easy-to-reach places. If you need to reach something above you, use a strong step stool  that has a grab bar. Keep electrical cords out of the way. Do not use floor polish or wax that makes floors slippery. If you must use wax, use non-skid floor wax. Do not have throw rugs and other things on the floor that can make you trip. What can I do with my stairs? Do not leave any items on the stairs. Make sure that there are handrails on both sides of the stairs and use them. Fix handrails that are broken or loose. Make sure that handrails are as long as the stairways. Check any carpeting to make sure that it is firmly attached to the stairs. Fix any carpet that is loose or worn. Avoid having throw rugs at the top or bottom of the stairs. If you do have throw rugs, attach them to the floor with carpet tape. Make sure that you have a light switch at the top of the stairs and the bottom of the stairs. If you do not have them, ask someone to add them for you. What else can I do to help prevent  falls? Wear shoes that: Do not have high heels. Have rubber bottoms. Are comfortable and fit you well. Are closed at the toe. Do not wear sandals. If you use a stepladder: Make sure that it is fully opened. Do not climb a closed stepladder. Make sure that both sides of the stepladder are locked into place. Ask someone to hold it for you, if possible. Clearly mark and make sure that you can see: Any grab bars or handrails. First and last steps. Where the edge of each step is. Use tools that help you move around (mobility aids) if they are needed. These include: Canes. Walkers. Scooters. Crutches. Turn on the lights when you go into a dark area. Replace any light bulbs as soon as they burn out. Set up your furniture so you have a clear path. Avoid moving your furniture around. If any of your floors are uneven, fix them. If there are any pets around you, be aware of where they are. Review your medicines with your doctor. Some medicines can make you feel dizzy. This can increase your chance of falling. Ask your doctor what other things that you can do to help prevent falls. This information is not intended to replace advice given to you by your health care provider. Make sure you discuss any questions you have with your health care provider. Document Released: 01/03/2009 Document Revised: 08/15/2015 Document Reviewed: 04/13/2014 Elsevier Interactive Patient Education  2017 [employer].

## 2022-08-11 ENCOUNTER — Ambulatory Visit (INDEPENDENT_AMBULATORY_CARE_PROVIDER_SITE_OTHER): Payer: PPO | Admitting: Family Medicine

## 2022-08-11 ENCOUNTER — Encounter: Payer: Self-pay | Admitting: Family Medicine

## 2022-08-11 VITALS — BP 120/59 | HR 65 | Temp 98.0°F | Resp 16 | Wt 163.0 lb

## 2022-08-11 DIAGNOSIS — E559 Vitamin D deficiency, unspecified: Secondary | ICD-10-CM

## 2022-08-11 DIAGNOSIS — I10 Essential (primary) hypertension: Secondary | ICD-10-CM | POA: Diagnosis not present

## 2022-08-11 DIAGNOSIS — N1831 Chronic kidney disease, stage 3a: Secondary | ICD-10-CM | POA: Diagnosis not present

## 2022-08-11 DIAGNOSIS — E1122 Type 2 diabetes mellitus with diabetic chronic kidney disease: Secondary | ICD-10-CM | POA: Diagnosis not present

## 2022-08-11 LAB — POCT GLYCOSYLATED HEMOGLOBIN (HGB A1C)
Est. average glucose Bld gHb Est-mCnc: 171
Hemoglobin A1C: 7.6 % — AB (ref 4.0–5.6)

## 2022-08-11 NOTE — Progress Notes (Signed)
Established patient visit    I,Roshena L Chambers,acting as a scribe for Mila Merry, MD.,have documented all relevant documentation on the behalf of Mila Merry, MD,as directed by  Mila Merry, MD while in the presence of Mila Merry, MD.   Patient: Tracie Gray   DOB: 07/17/1940   82 y.o. Female  MRN: [id number] Visit Date: 08/11/2022  Today's healthcare provider: Mila Merry, MD   Chief Complaint  Patient presents with   Diabetes   Subjective    HPI   Diabetes Mellitus Type II, Follow-up  Lab Results  Component Value Date   HGBA1C 6.9 (H) 03/02/2022   HGBA1C 6.6 (A) 10/31/2021   HGBA1C 6.8 (H) 07/21/2021   Wt Readings from Last 3 Encounters:  08/11/22 163 lb (73.9 kg)  07/20/22 160 lb (72.6 kg)  04/22/22 160 lb (72.6 kg)   Last seen for diabetes 5 months ago.  Management since then includes no changes. She reports fair compliance with treatment (diet controlled). She is not having side effects.  Symptoms: No fatigue No foot ulcerations  No appetite changes No nausea  No paresthesia of the feet  No polydipsia  No polyuria No visual disturbances   No vomiting     Home blood sugar records:  blood sugars are not checked  Episodes of hypoglycemia? No    Current insulin regiment: NA Most Recent Eye Exam: more than 1 year ago Current exercise: none Current diet habits: well balanced  Pertinent Labs: Lab Results  Component Value Date   CHOL 206 (H) 03/02/2022   HDL 56 03/02/2022   LDLCALC 127 (H) 03/02/2022   TRIG 129 03/02/2022   CHOLHDL 3.7 03/02/2022   Lab Results  Component Value Date   NA 140 04/23/2022   K 3.5 04/23/2022   CREATININE 1.19 (H) 04/23/2022   EGFR 46 (L) 04/23/2022   LABMICR 12.3 07/21/2021   MICRALBCREAT 34 (H) 07/21/2021     ---------------------------------------------------------------------------------------------------   Medications: Outpatient Medications Prior to Visit  Medication Sig   aspirin  81 MG tablet Take 81 mg by mouth daily.   Cholecalciferol (VITAMIN D-3) 5000 UNITS TABS Take 1 tablet by mouth every 7 (seven) days.   hydrochlorothiazide (HYDRODIURIL) 25 MG tablet Take 1 tablet (25 mg total) by mouth daily. Take 2 tablets daily (Patient taking differently: Take 50 mg by mouth daily.)   hyoscyamine (ANASPAZ) 0.125 MG TBDP disintergrating tablet Place 1 tablet (0.125 mg total) under the tongue every 4 (four) hours as needed for cramping (or diarrha).   ONETOUCH ULTRA test strip USE AS DIRECTED TO CHECK BLOOD SUGAR TWICE DAILY   valsartan (DIOVAN) 160 MG tablet Take 1 tablet (160 mg total) by mouth daily.   No facility-administered medications prior to visit.    Review of Systems  Constitutional:  Negative for appetite change, chills, fatigue and fever.  Respiratory:  Negative for chest tightness and shortness of breath.   Cardiovascular:  Negative for chest pain and palpitations.  Gastrointestinal:  Negative for abdominal pain, nausea and vomiting.  Neurological:  Negative for dizziness and weakness.       Objective    BP (!) 120/59   Pulse 65   Temp 98 F (36.7 C) (Oral)   Resp 16   Wt 163 lb (73.9 kg)   SpO2 96% Comment: room air  BMI 27.98 kg/m    Physical Exam  General appearance: Well developed, well nourished female, cooperative and in no acute distress Head: Normocephalic, without obvious abnormality,  atraumatic Respiratory: Respirations even and unlabored, normal respiratory rate Extremities: All extremities are intact.  Skin: Skin color, texture, turgor normal. No rashes seen  Psych: Appropriate mood and affect. Neurologic: Mental status: Alert, oriented to person, place, and time, thought content appropriate.   Results for orders placed or performed in visit on 08/11/22  POCT glycosylated hemoglobin (Hb A1C)  Result Value Ref Range   Hemoglobin A1C 7.6 (A) 4.0 - 5.6 %   Est. average glucose Bld gHb Est-mCnc 171     Assessment & Plan     1.  Type 2 diabetes mellitus with stage 3a chronic kidney disease, without long-term current use of insulin (HCC) A1c up since last visit. Encouraged reducing sweets and sugars in diet. May also be related to increase in hctz. - Urine microalbumin-creatinine with uACR  Follow up, check A1c in 4 months.   2. Stage 3a chronic kidney disease (HCC)  - Renal function panel  3. Vitamin D deficiency On once a week vitamin D supplement.   - VITAMIN D 25 Hydroxy (Vit-D Deficiency, Fractures)  4. Primary hypertension Well controlled.  Continue current medications.        The entirety of the information documented in the History of Present Illness, Review of Systems and Physical Exam were personally obtained by me. Portions of this information were initially documented by the CMA and reviewed by me for thoroughness and accuracy.     Mila Merry, MD  [facility] [phone] (phone) [phone] (fax)  [facility]

## 2022-08-11 NOTE — Patient Instructions (Signed)
.   Please review the attached list of medications and notify my office if there are any errors.   . Please bring all of your medications to every appointment so we can make sure that our medication list is the same as yours.   

## 2022-08-12 LAB — MICROALBUMIN / CREATININE URINE RATIO
Creatinine, Urine: 43.7 mg/dL
Microalb/Creat Ratio: 16 mg/g creat (ref 0–29)
Microalbumin, Urine: 7.1 ug/mL

## 2022-08-12 LAB — RENAL FUNCTION PANEL
Albumin: 3.9 g/dL (ref 3.7–4.7)
BUN/Creatinine Ratio: 11 — ABNORMAL LOW (ref 12–28)
BUN: 12 mg/dL (ref 8–27)
CO2: 26 mmol/L (ref 20–29)
Calcium: 9.4 mg/dL (ref 8.7–10.3)
Chloride: 99 mmol/L (ref 96–106)
Creatinine, Ser: 1.11 mg/dL — ABNORMAL HIGH (ref 0.57–1.00)
Glucose: 204 mg/dL — ABNORMAL HIGH (ref 70–99)
Phosphorus: 2.5 mg/dL — ABNORMAL LOW (ref 3.0–4.3)
Potassium: 3.3 mmol/L — ABNORMAL LOW (ref 3.5–5.2)
Sodium: 139 mmol/L (ref 134–144)
eGFR: 50 mL/min/{1.73_m2} — ABNORMAL LOW (ref >59)

## 2022-08-12 LAB — SPECIMEN STATUS REPORT

## 2022-08-12 LAB — VITAMIN D 25 HYDROXY (VIT D DEFICIENCY, FRACTURES): Vit D, 25-Hydroxy: 31.8 ng/mL (ref 30.0–100.0)

## 2022-08-13 MED ORDER — POTASSIUM CHLORIDE ER 10 MEQ PO TBCR: 10.0000 meq | EXTENDED_RELEASE_TABLET | Freq: Every day | ORAL | 3 refills | Status: DC

## 2022-08-13 NOTE — Addendum Note (Signed)
Addended by: Janey Greaser D on: 08/13/2022 11:45 AM   Modules accepted: Orders

## 2022-08-18 ENCOUNTER — Other Ambulatory Visit: Payer: Self-pay | Admitting: Family Medicine

## 2022-08-18 DIAGNOSIS — I129 Hypertensive chronic kidney disease with stage 1 through stage 4 chronic kidney disease, or unspecified chronic kidney disease: Secondary | ICD-10-CM

## 2022-08-18 NOTE — Telephone Encounter (Unsigned)
Copied from CRM [id number]. Topic: General - Other >> Aug 18, 2022 10:11 AM Everette C wrote: Reason for CRM: Medication Refill - Medication: valsartan (DIOVAN) 160 MG tablet [[id number]]  Has the patient contacted their pharmacy? Yes.   (Agent: If no, request that the patient contact the pharmacy for the refill. If patient does not wish to contact the pharmacy document the reason why and proceed with request.) (Agent: If yes, when and what did the pharmacy advise?)  Preferred Pharmacy (with phone number or street name): [facility] - [place], [place] - [address] [place] [address] Phone: [phone] Fax: [phone] Hours: Not open 24 hours   Has the patient been seen for an appointment in the last year OR does the patient have an upcoming appointment? Yes.    Agent: Please be advised that RX refills may take up to 3 business days. We ask that you follow-up with your pharmacy.

## 2022-08-19 NOTE — Telephone Encounter (Signed)
Reordered yesterday 08/18/22 to requested pharmacy  Requested Prescriptions  Refused Prescriptions Disp Refills   valsartan (DIOVAN) 160 MG tablet 90 tablet 1    Sig: Take 1 tablet (160 mg total) by mouth daily.     Cardiovascular:  Angiotensin Receptor Blockers Failed - 08/18/2022 10:45 AM      Failed - Cr in normal range and within 180 days    Creatinine, Ser  Date Value Ref Range Status  08/11/2022 1.11 (H) 0.57 - 1.00 mg/dL Final         Failed - K in normal range and within 180 days    Potassium  Date Value Ref Range Status  08/11/2022 3.3 (L) 3.5 - 5.2 mmol/L Final         Passed - Patient is not pregnant      Passed - Last BP in normal range    BP Readings from Last 1 Encounters:  08/11/22 (!) 120/59         Passed - Valid encounter within last 6 months    Recent Outpatient Visits           1 week ago Type 2 diabetes mellitus with stage 3a chronic kidney disease, without long-term current use of insulin (HCC)   [place] [facility] Malva Limes, MD   3 months ago Benign hypertension with chronic kidney disease, stage III [facility])   [place] [facility] Malva Limes, MD   5 months ago Benign hypertension with chronic kidney disease, stage III [facility])   [place] [facility] Malva Limes, MD   9 months ago Type 2 diabetes mellitus with stage 3a chronic kidney disease, without long-term current use of insulin (HCC)   [place] [facility] Malva Limes, MD   1 year ago Type 2 diabetes mellitus with stage 3a chronic kidney disease, without long-term current use of insulin (HCC)   [place] [facility] Malva Limes, MD       Future Appointments             In 3 months Fisher, Demetrios Isaacs, MD [facility]

## 2022-09-21 ENCOUNTER — Other Ambulatory Visit: Payer: Self-pay | Admitting: Family Medicine

## 2022-09-21 DIAGNOSIS — I129 Hypertensive chronic kidney disease with stage 1 through stage 4 chronic kidney disease, or unspecified chronic kidney disease: Secondary | ICD-10-CM

## 2022-10-13 DIAGNOSIS — R001 Bradycardia, unspecified: Secondary | ICD-10-CM | POA: Diagnosis not present

## 2022-10-13 DIAGNOSIS — I10 Essential (primary) hypertension: Secondary | ICD-10-CM | POA: Diagnosis not present

## 2022-10-13 DIAGNOSIS — E6609 Other obesity due to excess calories: Secondary | ICD-10-CM | POA: Diagnosis not present

## 2022-10-13 DIAGNOSIS — E119 Type 2 diabetes mellitus without complications: Secondary | ICD-10-CM | POA: Diagnosis not present

## 2022-11-20 ENCOUNTER — Other Ambulatory Visit: Payer: Self-pay | Admitting: Family Medicine

## 2022-11-20 DIAGNOSIS — N183 Chronic kidney disease, stage 3 unspecified: Secondary | ICD-10-CM

## 2022-11-20 NOTE — Telephone Encounter (Signed)
Last RF 08/18/22 #90 1 RF   Requested Prescriptions  Refused Prescriptions Disp Refills   valsartan (DIOVAN) 160 MG tablet [Pharmacy Med Name: VALSARTAN 160MG  TABLET] 90 tablet 1    Sig: TAKE ONE TABLET BY MOUTH ONCE A DAY     Cardiovascular:  Angiotensin Receptor Blockers Failed - 11/20/2022  2:27 PM      Failed - Cr in normal range and within 180 days    Creatinine, Ser  Date Value Ref Range Status  08/11/2022 1.11 (H) 0.57 - 1.00 mg/dL Final         Failed - K in normal range and within 180 days    Potassium  Date Value Ref Range Status  08/11/2022 3.3 (L) 3.5 - 5.2 mmol/L Final         Passed - Patient is not pregnant      Passed - Last BP in normal range    BP Readings from Last 1 Encounters:  08/11/22 (!) 120/59         Passed - Valid encounter within last 6 months    Recent Outpatient Visits           3 months ago Type 2 diabetes mellitus with stage 3a chronic kidney disease, without long-term current use of insulin (HCC)   [place] [facility] Malva L[facility], MD   7 months ago Benign hypertension with chronic kidney disease, stage III [facility])   [place] [facility] Malva Limes, MD   8 months ago Benign hypertension with chronic kidney disease, stage III [facility])   [place] [facility] Malva Limes, MD   1 year ago Type 2 diabetes mellitus with stage 3a chronic kidney disease, without long-term current use of insulin (HCC)   [place] [facility] Malva Limes, MD   1 year ago Type 2 diabetes mellitus with stage 3a chronic kidney disease, without long-term current use of insulin (HCC)    [facility] Malva Limes, MD       Future Appointments             In 3 weeks Fisher, Demetrios Isaacs, MD [facility], [facility]

## 2022-12-14 ENCOUNTER — Ambulatory Visit (INDEPENDENT_AMBULATORY_CARE_PROVIDER_SITE_OTHER): Payer: PPO | Admitting: Family Medicine

## 2022-12-14 ENCOUNTER — Encounter: Payer: Self-pay | Admitting: Family Medicine

## 2022-12-14 VITALS — BP 127/62 | HR 66 | Ht 64.0 in | Wt 158.4 lb

## 2022-12-14 DIAGNOSIS — I10 Essential (primary) hypertension: Secondary | ICD-10-CM

## 2022-12-14 DIAGNOSIS — E1122 Type 2 diabetes mellitus with diabetic chronic kidney disease: Secondary | ICD-10-CM

## 2022-12-14 DIAGNOSIS — E119 Type 2 diabetes mellitus without complications: Secondary | ICD-10-CM

## 2022-12-14 DIAGNOSIS — N1831 Chronic kidney disease, stage 3a: Secondary | ICD-10-CM | POA: Diagnosis not present

## 2022-12-14 DIAGNOSIS — Z23 Encounter for immunization: Secondary | ICD-10-CM

## 2022-12-14 DIAGNOSIS — K589 Irritable bowel syndrome without diarrhea: Secondary | ICD-10-CM

## 2022-12-14 LAB — POCT GLYCOSYLATED HEMOGLOBIN (HGB A1C): Hemoglobin A1C: 7.4 % — AB (ref 4.0–5.6)

## 2022-12-14 NOTE — Patient Instructions (Addendum)
Please review the attached list of medications and notify my office if there are any errors.   Try over the counter probiotic called Align for irritable bowel syndrome. If that doesn't help we can try some prescription medications.

## 2022-12-14 NOTE — Progress Notes (Signed)
Established patient visit   Patient: Tracie Gray   DOB: 01-Mar-1941   82 y.o. Female  MRN: [id number] Visit Date: 12/14/2022  Today's healthcare provider: Mila Merry, MD   Chief Complaint  Patient presents with   Medical Management of Chronic Issues    4 month follow up on diabetes, possible IBS, may be worse than before    Diabetes   Subjective    Discussed the use of AI scribe software for clinical note transcription with the patient, who gave verbal consent to proceed.  History of Present Illness   The patient, with a history of hypertension, diabetes and low potassium levels, presents for routine follow up and with ongoing bowel problems. The patient reports that she has diarrhea which is inconsistent and does not seem to be linked to any specific foods. The patient's husband notes that the patient often has an upset stomach after eating and has been eating less due to fear of triggering the diarrhea. The patient has not been taking any over-the-counter medications or supplements for the diarrhea. The patient also reports that she has been prescribed hyoscyamine in the past but has not been taking it regularly. The patient also experiences cramping with the diarrhea.       Medications: Outpatient Medications Prior to Visit  Medication Sig   aspirin 81 MG tablet Take 81 mg by mouth daily.   Cholecalciferol (VITAMIN D-3) 5000 UNITS TABS Take 1 tablet by mouth every 7 (seven) days.   hydrochlorothiazide (HYDRODIURIL) 25 MG tablet Take 2 tablets (50 mg total) by mouth daily.   hyoscyamine (ANASPAZ) 0.125 MG TBDP disintergrating tablet Place 1 tablet (0.125 mg total) under the tongue every 4 (four) hours as needed for cramping (or diarrha).   ONETOUCH ULTRA test strip USE AS DIRECTED TO CHECK BLOOD SUGAR TWICE DAILY   potassium chloride (KLOR-CON) 10 MEQ tablet Take 1 tablet (10 mEq total) by mouth daily.   valsartan (DIOVAN) 160 MG tablet TAKE ONE TABLET BY MOUTH ONCE  A DAY   No facility-administered medications prior to visit.   Review of Systems     Objective    BP 127/62 (BP Location: Right Arm, Patient Position: Sitting, Cuff Size: Normal)   Pulse 66   Ht 5\' 4"  (1.626 m)   Wt 158 lb 6.4 oz (71.8 kg)   SpO2 98%   BMI 27.19 kg/m    Physical Exam   CHEST: Lungs clear to auscultation. CARDIOVASCULAR: Heart sounds normal on auscultation. EXTREMITIES: No edema in feet or ankles.     Results for orders placed or performed in visit on 12/14/22  POCT HgB A1C  Result Value Ref Range   Hemoglobin A1C 7.4 (A) 4.0 - 5.6 %    Assessment & Plan        Irritable Bowel Syndrome Reports of inconsistent diarrhea and abdominal cramping. No clear food triggers identified. Previously prescribed hyoscyamine, but patient has not been taking it regularly. -Recommend trial of over-the-counter probiotic Align, taken daily. -Consider prescription medication if symptoms persist.  Type 2 Diabetes Mellitus A1C 7.4, slightly improved from 7.6 in May. Patient reports dietary adherence. -Continue current management, no medication needed at this time.  Hypokalemia Previously identified low potassium, patient currently taking potassium supplement. -Order lab work to check current potassium levels.  Hypertension Blood pressure well-controlled on current regimen, occasional home monitoring. -Continue current management.    No follow-ups on file.      Mila Merry, MD  [facility]  [facility] [phone] (phone) [phone] (fax)  [facility]

## 2022-12-15 DIAGNOSIS — E119 Type 2 diabetes mellitus without complications: Secondary | ICD-10-CM | POA: Diagnosis not present

## 2022-12-16 LAB — COMPREHENSIVE METABOLIC PANEL
ALT: 11 IU/L (ref 0–32)
AST: 15 IU/L (ref 0–40)
Albumin: 3.9 g/dL (ref 3.7–4.7)
Alkaline Phosphatase: 41 IU/L — ABNORMAL LOW (ref 44–121)
BUN/Creatinine Ratio: 10 — ABNORMAL LOW (ref 12–28)
BUN: 13 mg/dL (ref 8–27)
Bilirubin Total: 0.5 mg/dL (ref 0.0–1.2)
CO2: 28 mmol/L (ref 20–29)
Calcium: 9.5 mg/dL (ref 8.7–10.3)
Chloride: 101 mmol/L (ref 96–106)
Creatinine, Ser: 1.27 mg/dL — ABNORMAL HIGH (ref 0.57–1.00)
Globulin, Total: 2.7 g/dL (ref 1.5–4.5)
Glucose: 149 mg/dL — ABNORMAL HIGH (ref 70–99)
Potassium: 3.5 mmol/L (ref 3.5–5.2)
Sodium: 144 mmol/L (ref 134–144)
Total Protein: 6.6 g/dL (ref 6.0–8.5)
eGFR: 42 mL/min/{1.73_m2} — ABNORMAL LOW (ref >59)

## 2022-12-16 LAB — TSH: TSH: 2.17 u[IU]/mL (ref 0.450–4.500)

## 2022-12-16 LAB — RENAL FUNCTION PANEL: Phosphorus: 2.1 mg/dL — ABNORMAL LOW (ref 3.0–4.3)

## 2022-12-16 LAB — T4, FREE: Free T4: 1.16 ng/dL (ref 0.82–1.77)

## 2023-02-16 ENCOUNTER — Other Ambulatory Visit: Payer: Self-pay | Admitting: Family Medicine

## 2023-02-16 DIAGNOSIS — N183 Chronic kidney disease, stage 3 unspecified: Secondary | ICD-10-CM

## 2023-02-17 NOTE — Telephone Encounter (Signed)
Requested Prescriptions  Pending Prescriptions Disp Refills   valsartan (DIOVAN) 160 MG tablet [Pharmacy Med Name: VALSARTAN 160MG  TABLET] 90 tablet 1    Sig: TAKE ONE TABLET BY MOUTH ONCE A DAY     Cardiovascular:  Angiotensin Receptor Blockers Failed - 02/16/2023 11:14 AM      Failed - Cr in normal range and within 180 days    Creatinine, Ser  Date Value Ref Range Status  12/15/2022 1.27 (H) 0.57 - 1.00 mg/dL Final         Passed - K in normal range and within 180 days    Potassium  Date Value Ref Range Status  12/15/2022 3.5 3.5 - 5.2 mmol/L Final         Passed - Patient is not pregnant      Passed - Last BP in normal range    BP Readings from Last 1 Encounters:  12/14/22 127/62         Passed - Valid encounter within last 6 months    Recent Outpatient Visits           2 months ago Diabetes mellitus without complication (HCC)   [place] [facility] Malva Limes, MD   6 months ago Type 2 diabetes mellitus with stage 3a chronic kidney disease, without long-term current use of insulin (HCC)   [place] [facility] Malva Limes, MD   10 months ago Benign hypertension with chronic kidney disease, stage III [facility])   [place] [facility] Malva Limes, MD   11 months ago Benign hypertension with chronic kidney disease, stage III [facility])   [place] [facility] Malva Limes, MD   1 year ago Type 2 diabetes mellitus with stage 3a chronic kidney disease, without long-term current use of insulin (HCC)    [facility] Mal[facility] L[facility], MD       Future Appointments             In 2 months Fisher, Demetrios Isaacs, MD [facility]

## 2023-04-19 ENCOUNTER — Ambulatory Visit (INDEPENDENT_AMBULATORY_CARE_PROVIDER_SITE_OTHER): Payer: PPO | Admitting: Family Medicine

## 2023-04-19 VITALS — BP 146/58 | HR 67 | Resp 18 | Ht 64.0 in | Wt 159.2 lb

## 2023-04-19 DIAGNOSIS — E1122 Type 2 diabetes mellitus with diabetic chronic kidney disease: Secondary | ICD-10-CM | POA: Diagnosis not present

## 2023-04-19 DIAGNOSIS — N1831 Chronic kidney disease, stage 3a: Secondary | ICD-10-CM | POA: Diagnosis not present

## 2023-04-19 NOTE — Progress Notes (Signed)
      Established patient visit   Patient: Tracie Gray   DOB: 09/07/1940   83 y.o. Female  MRN: [id number] Visit Date: 04/19/2023  Today's healthcare provider: Mila Merry, MD   Chief Complaint  Patient presents with   Diabetes   Subjective    Follow up diabetes and ckd. Feels well no  complaints. Doesn't check home sugars anymore. Advised to increase water intake after labs done in September.  Lab Results  Component Value Date   NA 144 12/15/2022   CL 101 12/15/2022   K 3.5 12/15/2022   CO2 28 12/15/2022   BUN 13 12/15/2022   CREATININE 1.27 (H) 12/15/2022   EGFR 42 (L) 12/15/2022   CALCIUM 9.5 12/15/2022   PHOS 2.1 (L) 12/15/2022   ALBUMIN 3.9 12/15/2022   GLUCOSE 149 (H) 12/15/2022   Lab Results  Component Value Date   HGBA1C 7.4 (A) 12/14/2022   HGBA1C 7.6 (A) 08/11/2022   HGBA1C 6.9 (H) 03/02/2022     Medications: Outpatient Medications Prior to Visit  Medication Sig   aspirin 81 MG tablet Take 81 mg by mouth daily.   Cholecalciferol (VITAMIN D-3) 125 MCG (5000 UT) TABS Take 5,000 Units by mouth once a week.   hydrochlorothiazide (HYDRODIURIL) 25 MG tablet Take 2 tablets (50 mg total) by mouth daily.   hyoscyamine (ANASPAZ) 0.125 MG TBDP disintergrating tablet Place 1 tablet (0.125 mg total) under the tongue every 4 (four) hours as needed for cramping (or diarrha).   ONETOUCH ULTRA test strip USE AS DIRECTED TO CHECK BLOOD SUGAR TWICE DAILY   potassium chloride (KLOR-CON) 10 MEQ tablet Take 1 tablet (10 mEq total) by mouth daily.   valsartan (DIOVAN) 160 MG tablet TAKE ONE TABLET BY MOUTH ONCE A DAY   Cholecalciferol (VITAMIN D-3) 5000 UNITS TABS Take 1 tablet by mouth every 7 (seven) days.   No facility-administered medications prior to visit.    Review of Systems  Constitutional:  Negative for appetite change, chills, fatigue and fever.  Respiratory:  Negative for chest tightness and shortness of breath.   Cardiovascular:  Negative for chest  pain and palpitations.  Gastrointestinal:  Negative for abdominal pain, nausea and vomiting.  Neurological:  Negative for dizziness and weakness.       Objective    BP (!) 146/58   Pulse 67   Resp 18   Ht 5\' 4"  (1.626 m)   Wt 159 lb 3.2 oz (72.2 kg)   SpO2 95%   BMI 27.33 kg/m    Physical Exam  General appearance: Well developed, well nourished female, cooperative and in no acute distress Head: Normocephalic, without obvious abnormality, atraumatic Respiratory: Respirations even and unlabored, normal respiratory rate Extremities: All extremities are intact.  Skin: Skin color, texture, turgor normal. No rashes seen  Psych: Appropriate mood and affect. Neurologic: Mental status: Alert, oriented to person, place, and time, thought content appropriate.   Assessment & Plan     1. Type 2 diabetes mellitus with stage 3a chronic kidney disease, without long-term current use of insulin (HCC) (Primary) Doing well. Working on drinking more water.  - Renal function panel - Hemoglobin A1c    Return in about 4 months (around 08/17/2023) for Hypertension, Diabetes.         Mila Merry, MD  [facility] [phone] (phone) [phone] (fax)  [facility]

## 2023-04-19 NOTE — Patient Instructions (Signed)
Tracie Gray  Please review the attached list of medications and notify my office if there are any errors.   . Please bring all of your medications to every appointment so we can make sure that our medication list is the same as yours.

## 2023-04-20 ENCOUNTER — Other Ambulatory Visit: Payer: Self-pay

## 2023-04-20 DIAGNOSIS — I10 Essential (primary) hypertension: Secondary | ICD-10-CM

## 2023-04-20 LAB — RENAL FUNCTION PANEL
Albumin: 3.8 g/dL (ref 3.7–4.7)
BUN/Creatinine Ratio: 14 (ref 12–28)
BUN: 19 mg/dL (ref 8–27)
CO2: 23 mmol/L (ref 20–29)
Calcium: 9.5 mg/dL (ref 8.7–10.3)
Chloride: 102 mmol/L (ref 96–106)
Creatinine, Ser: 1.34 mg/dL — ABNORMAL HIGH (ref 0.57–1.00)
Glucose: 174 mg/dL — ABNORMAL HIGH (ref 70–99)
Phosphorus: 2.8 mg/dL — ABNORMAL LOW (ref 3.0–4.3)
Potassium: 3.7 mmol/L (ref 3.5–5.2)
Sodium: 143 mmol/L (ref 134–144)
eGFR: 39 mL/min/{1.73_m2} — ABNORMAL LOW (ref >59)

## 2023-04-20 LAB — HEMOGLOBIN A1C
Est. average glucose Bld gHb Est-mCnc: 148 mg/dL
Hgb A1c MFr Bld: 6.8 % — ABNORMAL HIGH (ref 4.8–5.6)

## 2023-04-20 MED ORDER — AMLODIPINE BESYLATE 2.5 MG PO TABS: 2.5000 mg | ORAL_TABLET | Freq: Every day | ORAL | 0 refills | Status: DC

## 2023-04-26 DIAGNOSIS — E6609 Other obesity due to excess calories: Secondary | ICD-10-CM | POA: Diagnosis not present

## 2023-04-26 DIAGNOSIS — R001 Bradycardia, unspecified: Secondary | ICD-10-CM | POA: Diagnosis not present

## 2023-04-26 DIAGNOSIS — E119 Type 2 diabetes mellitus without complications: Secondary | ICD-10-CM | POA: Diagnosis not present

## 2023-04-26 DIAGNOSIS — I10 Essential (primary) hypertension: Secondary | ICD-10-CM | POA: Diagnosis not present

## 2023-04-26 DIAGNOSIS — Z87891 Personal history of nicotine dependence: Secondary | ICD-10-CM | POA: Diagnosis not present

## 2023-04-28 ENCOUNTER — Ambulatory Visit: Payer: Self-pay | Admitting: *Deleted

## 2023-04-28 DIAGNOSIS — I129 Hypertensive chronic kidney disease with stage 1 through stage 4 chronic kidney disease, or unspecified chronic kidney disease: Secondary | ICD-10-CM

## 2023-04-28 NOTE — Telephone Encounter (Signed)
 Message from [name] sent at 04/28/2023  2:59 PM EST  Summary: med reaction   Pt called in says, med, amLODipine  (NORVASC ) 2.5 MG tablet, breaks her out , so she isnt gonna take it anymore          Call History  Contact Date/Time Type Contact Phone/Fax By  04/28/2023 02:59 PM EST Phone (Incoming) Tracie, Gray (Self) [phone] MIKE) Benton, [name]   Reason for Disposition  [1] Caller has URGENT medicine question about med that PCP or specialist prescribed AND [2] triager unable to answer question  Answer Assessment - Initial Assessment Questions 1. NAME of MEDICINE: What medicine(s) are you calling about?     Amlodipine  2.5 mg  Norvasc  I took years ago from another doctor.  He put me on it and I broke out.  My lips would swell.   I'm afraid of taking this.     My husband picked it up from the pharmacy.   But I will not take this.   I don't take I can take beta blockers due to this. 2. QUESTION: What is your question? (e.g., double dose of medicine, side effect)     I'm not going to take it.    3. PRESCRIBER: Who prescribed the medicine? Reason: if prescribed by specialist, call should be referred to that group.     Dr. Gasper 4. SYMPTOMS: Do you have any symptoms? If Yes, ask: What symptoms are you having?  How bad are the symptoms (e.g., mild, moderate, severe)     I've had reactions to Norvasc  many years ago.   I've been on the BP medications I'm on now for years.    5. PREGNANCY:  Is there any chance that you are pregnant? When was your last menstrual period?     N/A due to age  Protocols used: Medication Question Call-A-AH

## 2023-04-28 NOTE — Telephone Encounter (Signed)
  Chief Complaint: She is not going to take the Amlodipine  (Norvasc ) 2.5 mg because she had a really bad reaction to Norvasc  many years ago.   Her lips swelled and she felt really bad.   It really scared me.   I don't think I can take any beta blockers because the doctor I was seeing many years ago tried me on different samples and I could not take them and I think they were beta blockers.     I prefer to stay on the regimen I was on for my BP.     I had a cardiology appt after I saw Dr. Gasper and my BP was fine in their office. Symptoms: Caused her lips to swell and she felt really bad when she took Norvasc  before. Frequency: Has not taken this rx.   She got it from the pharmacy but she's going to toss it.   Pertinent Negatives: Patient denies Taking any of this rx for the Norvasc . Disposition: [] ED /[] Urgent Care (no appt availability in office) / [] Appointment(In office/virtual)/ []  [facility]/ [] Home Care/ [] Refused Recommended Disposition /[] [facility]/ [x]  Follow-up with PCP Additional Notes: Message sent to Dr. Gasper.  Pt is asking to stay on her current regimen for BP control.    She did cut down on the hydrochlorothiazide  as instructed.

## 2023-04-29 MED ORDER — HYDROCHLOROTHIAZIDE 25 MG PO TABS: 25.0000 mg | ORAL_TABLET | Freq: Every day | ORAL | Status: DC

## 2023-04-29 NOTE — Telephone Encounter (Signed)
 Ok, but need to drink more water since kidney functions have worsened. Will recheck BP and kidney functions at follow up appointment at end of month.

## 2023-04-29 NOTE — Addendum Note (Signed)
 Addended by: Lamon Pillow on: 04/29/2023 08:19 AM   Modules accepted: Orders

## 2023-04-29 NOTE — Telephone Encounter (Signed)
 [id number] for [facility] Nurse to give patient provider's message

## 2023-05-03 NOTE — Telephone Encounter (Signed)
 Patient scheduled 05/21/2023

## 2023-05-21 ENCOUNTER — Ambulatory Visit: Payer: Self-pay | Admitting: Family Medicine

## 2023-07-21 ENCOUNTER — Ambulatory Visit: Payer: Self-pay

## 2023-07-21 DIAGNOSIS — E1122 Type 2 diabetes mellitus with diabetic chronic kidney disease: Secondary | ICD-10-CM | POA: Diagnosis not present

## 2023-07-21 DIAGNOSIS — Z0000 Encounter for general adult medical examination without abnormal findings: Secondary | ICD-10-CM

## 2023-07-21 DIAGNOSIS — N1831 Chronic kidney disease, stage 3a: Secondary | ICD-10-CM | POA: Diagnosis not present

## 2023-07-21 DIAGNOSIS — Z0001 Encounter for general adult medical examination with abnormal findings: Secondary | ICD-10-CM

## 2023-07-21 NOTE — Progress Notes (Signed)
 Subjective:   Tracie Gray is a 83 y.o. who presents for a Medicare Wellness preventive visit.  Visit Complete: Virtual I connected with  Ladene Pickle on 07/21/23 by a audio enabled telemedicine application and verified that I am speaking with the correct person using two identifiers.  Patient Location: Home  Provider Location: Home Office  I discussed the limitations of evaluation and management by telemedicine. The patient expressed understanding and agreed to proceed.  Vital Signs: Because this visit was a virtual/telehealth visit, some criteria may be missing or patient reported. Any vitals not documented were not able to be obtained and vitals that have been documented are patient reported.  VideoDeclined- This patient declined Librarian, academic. Therefore the visit was completed with audio only.  Persons Participating in Visit: Patient.  AWV Questionnaire: No: Patient Medicare AWV questionnaire was not completed prior to this visit.  Cardiac Risk Factors include: advanced age (>83men, >5 women);diabetes mellitus;hypertension;dyslipidemia     Objective:    There were no vitals filed for this visit. There is no height or weight on file to calculate BMI.     07/21/2023    8:21 AM 07/20/2022    8:59 AM 07/15/2021    9:07 AM 03/13/2019   10:13 AM 03/08/2018   10:42 AM 03/03/2017    2:07 PM 01/31/2015   10:05 AM  Advanced Directives  Does Patient Have a Medical Advance Directive? No Yes No Yes Yes Yes Yes  Type of Furniture conservator/restorer;Living will  Healthcare Power of [place];Living will Healthcare Power of [place];Living will Living will;Healthcare Power of [employer] Power of [place];Living will  Copy of Healthcare Power of Attorney in Chart?    No - copy requested No - copy requested No - copy requested   Would patient like information on creating a medical advance directive? No - Patient  declined  No - Patient declined        Current Medications (verified) Outpatient Encounter Medications as of 07/21/2023  Medication Sig   aspirin 81 MG tablet Take 81 mg by mouth daily.   Cholecalciferol (VITAMIN D -3) 125 MCG (5000 UT) TABS Take 5,000 Units by mouth once a week.   hydrochlorothiazide  (HYDRODIURIL ) 25 MG tablet Take 1 tablet (25 mg total) by mouth daily.   hyoscyamine  (ANASPAZ ) 0.125 MG TBDP disintergrating tablet Place 1 tablet (0.125 mg total) under the tongue every 4 (four) hours as needed for cramping (or diarrha).   ONETOUCH ULTRA test strip USE AS DIRECTED TO CHECK BLOOD SUGAR TWICE DAILY   potassium chloride  (KLOR-CON ) 10 MEQ tablet Take 1 tablet (10 mEq total) by mouth daily.   valsartan  (DIOVAN ) 160 MG tablet TAKE ONE TABLET BY MOUTH ONCE A DAY   No facility-administered encounter medications on file as of 07/21/2023.    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   Hypertension    Personal history of colonic polyps-adenomas 12/03/2011   Thyroid  disease    Past Surgical History:  Procedure Laterality Date   ABDOMINAL HYSTERECTOMY  1981   CATARACT EXTRACTION Bilateral 2003   CESAREAN SECTION  1981   CHOLECYSTECTOMY  1982   RETINOPATHY SURGERY Right    Family History  Problem Relation Age of Onset   Cancer Mother  Coronary artery disease Father    Heart disease Father    Heart attack Father    Heart attack Paternal Grandfather    Colon cancer Neg Hx    Rectal cancer Neg Hx    Stomach cancer Neg Hx    Esophageal cancer Neg Hx    Social History   Socioeconomic History   Marital status: Married    Spouse name: Hewitt Lou   Number of children: 2   Years of education: Not on file   Highest education level: Some college, no degree   Occupational History   Occupation: retired  Tobacco Use   Smoking status: Former    Current packs/day: 0.00    Average packs/day: 1 pack/day for 1 year (1.0 ttl pk-yrs)    Types: Cigarettes    Start date: 03/23/1961    Quit date: 03/23/1962    Years since quitting: 61.3   Smokeless tobacco: Never  Vaping Use   Vaping status: Never Used  Substance and Sexual Activity   Alcohol use: No   Drug use: No   Sexual activity: Not on file  Other Topics Concern   Not on file  Social History Narrative   2 biological and 1 adopted    Social Drivers of Health   Financial Resource Strain: Low Risk  (07/21/2023)   Overall Financial Resource Strain (CARDIA)    Difficulty of Paying Living Expenses: Not hard at all  Food Insecurity: No Food Insecurity (07/21/2023)   Hunger Vital Sign    Worried About Running Out of Food in the Last Year: Never true    Ran Out of Food in the Last Year: Never true  Transportation Needs: No Transportation Needs (07/21/2023)   PRAPARE - Administrator, Civil Service (Medical): No    Lack of Transportation (Non-Medical): No  Physical Activity: Insufficiently Active (07/21/2023)   Exercise Vital Sign    Days of Exercise per Week: 3 days    Minutes of Exercise per Session: 30 min  Stress: No Stress Concern Present (07/21/2023)   [employer] of Occupational Health - Occupational Stress Questionnaire    Feeling of Stress : Not at all  Social Connections: Moderately Isolated (07/21/2023)   Social Connection and Isolation Panel [NHANES]    Frequency of Communication with Friends and Family: Twice a week    Frequency of Social Gatherings with Friends and Family: Once a week    Attends Religious Services: Never    Database administrator or Organizations: No    Attends Engineer, structural: Never    Marital Status: Married    Tobacco Counseling Counseling given: Not Answered    Clinical Intake:  Pre-visit preparation completed: Yes  Pain  : No/denies pain     BMI - recorded: 27.3 Nutritional Status: BMI 25 -29 Overweight Nutritional Risks: None Diabetes: Yes CBG done?: No Did pt. bring in CBG monitor from home?: No  Lab Results  Component Value Date   HGBA1C 6.8 (H) 04/19/2023   HGBA1C 7.4 (A) 12/14/2022   HGBA1C 7.6 (A) 08/11/2022     How often do you need to have someone help you when you read instructions, pamphlets, or other written materials from your doctor or pharmacy?: 1 - Never  Interpreter Needed?: No  Information entered by :: Dellie Fergusson, LPN   Activities of Daily Living    07/21/2023    8:23 AM  In your present state of health, do you have any difficulty performing the following activities:  Hearing? 0  Vision? 0  Difficulty concentrating or making decisions? 1  Walking or climbing stairs? 1  Comment SHOB  Dressing or bathing? 0  Doing errands, shopping? 0  Preparing Food and eating ? N  Using the Toilet? N  In the past six months, have you accidently leaked urine? Y  Do you have problems with loss of bowel control? Y  Managing your Medications? N  Managing your Finances? N  Housekeeping or managing your Housekeeping? N    Patient Care Team: Lamon Pillow, MD as PCP - General (Family Medicine) Cara Chancellor Wiliam Harder, MD as Consulting Physician (Cardiology) Kenney Peacemaker, MD as Consulting Physician (Gastroenterology) Arminda Berth, OD (Optometry) Pa, [facility]  Indicate any recent Medical Services you may have received from other than [facility] providers in the past year (date may be approximate).     Assessment:   This is a routine wellness examination for [place].  Hearing/Vision screen Hearing Screening - Comments:: NO AIDS Vision Screening - Comments:: READERS- PATTY VISION   Goals Addressed             This Visit's Progress    DIET - REDUCE SUGAR INTAKE         Depression Screen     07/21/2023    8:19 AM 04/19/2023   10:04 AM 07/20/2022    8:56 AM  04/22/2022   11:13 AM 03/02/2022   10:17 AM 07/15/2021    9:06 AM 02/24/2021    8:50 AM  PHQ 2/9 Scores  PHQ - 2 Score 0 0 0 0 0 0 0  PHQ- 9 Score 0 1  2 1  1     Fall Risk     07/21/2023    8:23 AM 04/19/2023   10:04 AM 07/20/2022    8:53 AM 04/22/2022   11:13 AM 03/02/2022   10:17 AM  Fall Risk   Falls in the past year? 0 0 0 0 0  Number falls in past yr: 0 0 0 0 0  Injury with Fall? 0 0 0 0 0  Risk for fall due to : No Fall Risks  No Fall Risks No Fall Risks No Fall Risks  Follow up Falls prevention discussed;Falls evaluation completed  Education provided;Falls prevention discussed Falls evaluation completed Falls evaluation completed    MEDICARE RISK AT HOME:  Medicare Risk at Home Any stairs in or around the home?: Yes If so, are there any without handrails?: No Home free of loose throw rugs in walkways, pet beds, electrical cords, etc?: Yes Adequate lighting in your home to reduce risk of falls?: Yes Life alert?: No Use of a cane, walker or w/c?: Yes (CANE ALL THE TIME) Grab bars in the bathroom?: Yes Shower chair or bench in shower?: Yes Elevated toilet seat or a handicapped toilet?: Yes  TIMED UP AND GO:  Was the test performed?  No  Cognitive Function: 6CIT completed        07/21/2023    8:26 AM 07/20/2022    9:10 AM 07/15/2021    9:12 AM  6CIT Screen  What Year? 0 points 0 points 0 points  What month? 0 points 0 points 0 points  What time? 0 points 0 points 0 points  Count back from 20 0 points 0 points 0 points  Months in reverse 0 points 0 points 0 points  Repeat phrase 2 points 0 points 0 points  Total Score 2 points 0 points 0 points    Immunizations  Immunization History  Administered Date(s) Administered   Fluad Quad(high Dose 65+) 03/02/2022   Fluad Trivalent(High Dose 65+) 12/14/2022   Influenza, High Dose Seasonal PF 02/05/2016, 02/09/2017, 03/08/2018   Influenza,inj,Quad PF,6+ Mos 01/31/2015   Pneumococcal Conjugate-13 01/31/2015    Pneumococcal Polysaccharide-23 11/04/2011   Tdap 11/04/2011   Zoster Recombinant(Shingrix) 04/21/2018, 01/24/2019    Screening Tests Health Maintenance  Topic Date Due   OPHTHALMOLOGY EXAM  04/25/2016   DTaP/Tdap/Td (2 - Td or Tdap) 11/03/2021   COVID-19 Vaccine (1 - 2024-25 season) Never done   Diabetic kidney evaluation - Urine ACR  08/11/2023   HEMOGLOBIN A1C  10/17/2023   INFLUENZA VACCINE  10/22/2023   Diabetic kidney evaluation - eGFR measurement  04/18/2024   Medicare Annual Wellness (AWV)  07/20/2024   DEXA SCAN  04/16/2026   Pneumonia Vaccine 67+ Years old  Completed   Zoster Vaccines- Shingrix  Completed   HPV VACCINES  Aged Out   Meningococcal B Vaccine  Aged Out   Colonoscopy  Discontinued    Health Maintenance  Health Maintenance Due  Topic Date Due   OPHTHALMOLOGY EXAM  04/25/2016   DTaP/Tdap/Td (2 - Td or Tdap) 11/03/2021   COVID-19 Vaccine (1 - 2024-25 season) Never done   Diabetic kidney evaluation - Urine ACR  08/11/2023   Health Maintenance Items Addressed: DM LABS ORDERED, AGED OUT FOR MAMMOGRAM & COLONOSCOPY; UP TO DATE ON BONE DENSITY; UP TO DATE ON SHOTS EXCEPT PNA & TDAP  Additional Screening:  Vision Screening: Recommended annual ophthalmology exams for early detection of glaucoma and other disorders of the eye.  Dental Screening: Recommended annual dental exams for proper oral hygiene  Community Resource Referral / Chronic Care Management: CRR required this visit?  No   CCM required this visit?  No     Plan:     I have personally reviewed and noted the following in the patient's chart:   Medical and social history Use of alcohol, tobacco or illicit drugs  Current medications and supplements including opioid prescriptions. Patient is not currently taking opioid prescriptions. Functional ability and status Nutritional status Physical activity Advanced directives List of other physicians Hospitalizations, surgeries, and ER visits in  previous 12 months Vitals Screenings to include cognitive, depression, and falls Referrals and appointments  In addition, I have reviewed and discussed with patient certain preventive protocols, quality metrics, and best practice recommendations. A written personalized care plan for preventive services as well as general preventive health recommendations were provided to patient.     Pinky Bright, LPN   1/61/0960   After Visit Summary: (MyChart) Due to this being a telephonic visit, the after visit summary with patients personalized plan was offered to patient via MyChart   Notes: DM LABS ORDERED

## 2023-07-21 NOTE — Patient Instructions (Addendum)
 Ms. Addeo , Thank you for taking time to come for your Medicare Wellness Visit. I appreciate your ongoing commitment to your health goals. Please review the following plan we discussed and let me know if I can assist you in the future.   Referrals/Orders/Follow-Ups/Clinician Recommendations: NONE  This is a list of the screening recommended for you and due dates:  Health Maintenance  Topic Date Due   Eye exam for diabetics  04/25/2016   DTaP/Tdap/Td vaccine (2 - Td or Tdap) 11/03/2021   COVID-19 Vaccine (1 - 2024-25 season) Never done   Yearly kidney health urinalysis for diabetes  08/11/2023   Hemoglobin A1C  10/17/2023   Flu Shot  10/22/2023   Yearly kidney function blood test for diabetes  04/18/2024   Medicare Annual Wellness Visit  07/20/2024   DEXA scan (bone density measurement)  04/16/2026   Pneumonia Vaccine  Completed   Zoster (Shingles) Vaccine  Completed   HPV Vaccine  Aged Out   Meningitis B Vaccine  Aged Out   Colon Cancer Screening  Discontinued    Advanced directives: (ACP Link)Information on Advanced Care Planning can be found at [place]  Secretary of [facility] Advance Health Care Directives Advance Health Care Directives. [email or web address]   Next Medicare Annual Wellness Visit scheduled for next year: Yes  07/26/24 @ 8:10 AM BY PHONE

## 2023-07-26 ENCOUNTER — Other Ambulatory Visit: Payer: Self-pay | Admitting: Family Medicine

## 2023-08-10 ENCOUNTER — Other Ambulatory Visit: Payer: Self-pay | Admitting: Family Medicine

## 2023-08-10 DIAGNOSIS — N183 Chronic kidney disease, stage 3 unspecified: Secondary | ICD-10-CM

## 2023-08-17 ENCOUNTER — Encounter: Payer: Self-pay | Admitting: Family Medicine

## 2023-08-17 ENCOUNTER — Ambulatory Visit (INDEPENDENT_AMBULATORY_CARE_PROVIDER_SITE_OTHER): Payer: Self-pay | Admitting: Family Medicine

## 2023-08-17 VITALS — BP 138/60 | HR 64 | Ht 64.0 in | Wt 156.7 lb

## 2023-08-17 DIAGNOSIS — E1122 Type 2 diabetes mellitus with diabetic chronic kidney disease: Secondary | ICD-10-CM | POA: Diagnosis not present

## 2023-08-17 DIAGNOSIS — N1831 Chronic kidney disease, stage 3a: Secondary | ICD-10-CM | POA: Diagnosis not present

## 2023-08-17 DIAGNOSIS — N6321 Unspecified lump in the left breast, upper outer quadrant: Secondary | ICD-10-CM | POA: Diagnosis not present

## 2023-08-17 DIAGNOSIS — I10 Essential (primary) hypertension: Secondary | ICD-10-CM | POA: Diagnosis not present

## 2023-08-17 DIAGNOSIS — E559 Vitamin D deficiency, unspecified: Secondary | ICD-10-CM | POA: Diagnosis not present

## 2023-08-17 NOTE — Progress Notes (Signed)
 Established patient visit   Patient: Tracie Gray   DOB: [birth year]   83 y.o. Female  MRN: [id number] Visit Date: 08/17/2023  Today's healthcare provider: Jeralene Mom, MD   Chief Complaint  Patient presents with   Hypertension   Diabetes    No diabetic eye exam scheduled.    Breast Problem    L breast found a lump. Referral to gynecologist    Subjective    Discussed the use of AI scribe software for clinical note transcription with the patient, who gave verbal consent to proceed.  History of Present Illness   Tracie Gray is an 83 year old female who presents for a checkup focusing on blood sugar, blood pressure, and a new breast lump.  She has a new lump in her left breast, discovered while bathing. The lump is non-tender and located towards the lateral aspect of the breast. She has a history of similar lumps approximately 20 years ago, which were aspirated. Her last mammogram was about 10 years ago. No associated soreness or pain with the breast lump.  She is here for routine monitoring of her blood sugar and blood pressure. She used to check her blood sugar daily but has become discouraged over time. She does not take any medication for her blood sugar and mentions that her levels have been stable for a long time.  She previously experienced swelling in her feet and ankles, which has resolved. She drinks more water when it is warm outside but tends to drink less during the winter months. No current swelling in her feet and ankles.     Lab Results  Component Value Date   NA 143 04/19/2023   K 3.7 04/19/2023   CREATININE 1.34 (H) 04/19/2023   EGFR 39 (L) 04/19/2023   GLUCOSE 174 (H) 04/19/2023   Lab Results  Component Value Date   HGBA1C 6.8 (H) 04/19/2023     Medications: Outpatient Medications Prior to Visit  Medication Sig   aspirin 81 MG tablet Take 81 mg by mouth daily.   Cholecalciferol (VITAMIN D -3) 125 MCG (5000 UT) TABS Take 5,000 Units  by mouth once a week.   hydrochlorothiazide  (HYDRODIURIL ) 25 MG tablet Take 1 tablet (25 mg total) by mouth daily.   hyoscyamine  (ANASPAZ ) 0.125 MG TBDP disintergrating tablet Place 1 tablet (0.125 mg total) under the tongue every 4 (four) hours as needed for cramping (or diarrha).   ONETOUCH ULTRA test strip USE AS DIRECTED TO CHECK BLOOD SUGAR TWICE DAILY   potassium chloride  (KLOR-CON ) 10 MEQ tablet TAKE ONE TABLET (10 MEQ TOTAL) BY MOUTH DAILY.   valsartan  (DIOVAN ) 160 MG tablet TAKE ONE TABLET BY MOUTH ONCE A DAY   No facility-administered medications prior to visit.   Review of Systems  Constitutional:  Negative for appetite change, chills, fatigue and fever.  Respiratory:  Negative for chest tightness and shortness of breath.   Cardiovascular:  Negative for chest pain and palpitations.  Gastrointestinal:  Negative for abdominal pain, nausea and vomiting.  Neurological:  Negative for dizziness and weakness.       Objective    BP 138/60 (BP Location: Left Arm, Patient Position: Sitting, Cuff Size: Normal)   Pulse 64   Ht 5\' 4"  (1.626 m)   Wt 156 lb 11.2 oz (71.1 kg)   SpO2 98%   BMI 26.90 kg/m   Physical Exam   General: Appearance:    Well developed, well nourished female in no acute distress  Eyes:    PERRL, conjunctiva/corneas clear, EOM's intact       Lungs:     Clear to auscultation bilaterally, respirations unlabored  Heart:    Normal heart rate. Normal rhythm. No murmurs, rubs, or gallops.    MS:   All extremities are intact.    Neurologic:   Awake, alert, oriented x 3. No apparent focal neurological defect.         Assessment & Plan        Breast lump New left breast lump, likely cyst or new growth. No recent mammogram. History of similar lumps aspirated. - Order mammogram and breast ultrasound. - Schedule imaging at [facility] [place].  Chronic kidney disease, unspecified Previous labs showed slightly abnormal kidney function. Emphasized hydration  importance. - Order blood work for kidney function. - Obtain urine sample for kidney assessment.  Diabetes, unspecified Diabetes managed without medication. A1c stable despite inconsistent glucose monitoring. - Order blood work for A1c and diabetes parameters.  General Health Maintenance Discussed RSV vaccine importance for those over 80. Recommended pharmacy for insurance coverage. - Recommend RSV vaccine from pharmacy this summer.        Jeralene Mom, MD  [facility] [phone] (phone) [phone] (fax)  [facility]

## 2023-08-17 NOTE — Patient Instructions (Addendum)
 Please review the attached list of medications and notify my office if there are any errors.   Please bring all of your medications to every appointment so we can make sure that our medication list is the same as yours.   I recommend that you get the RSV vaccine (Arexvy) to protect yourself from certain types of viral pneumonia. This vaccine is typically covered under Medicare prescription plans and should be covered if you get it at your pharmacy.

## 2023-08-18 ENCOUNTER — Ambulatory Visit: Payer: Self-pay | Admitting: Family Medicine

## 2023-08-18 LAB — CBC
Hematocrit: 39.9 % (ref 34.0–46.6)
Hemoglobin: 13.5 g/dL (ref 11.1–15.9)
MCH: 32.8 pg (ref 26.6–33.0)
MCHC: 33.8 g/dL (ref 31.5–35.7)
MCV: 97 fL (ref 79–97)
Platelets: 261 10*3/uL (ref 150–450)
RBC: 4.11 x10E6/uL (ref 3.77–5.28)
RDW: 12.8 % (ref 11.7–15.4)
WBC: 8 10*3/uL (ref 3.4–10.8)

## 2023-08-18 LAB — RENAL FUNCTION PANEL
Albumin: 4 g/dL (ref 3.7–4.7)
BUN/Creatinine Ratio: 11 — ABNORMAL LOW (ref 12–28)
BUN: 15 mg/dL (ref 8–27)
CO2: 24 mmol/L (ref 20–29)
Calcium: 10 mg/dL (ref 8.7–10.3)
Chloride: 101 mmol/L (ref 96–106)
Creatinine, Ser: 1.31 mg/dL — ABNORMAL HIGH (ref 0.57–1.00)
Glucose: 138 mg/dL — ABNORMAL HIGH (ref 70–99)
Phosphorus: 3.1 mg/dL (ref 3.0–4.3)
Potassium: 3.6 mmol/L (ref 3.5–5.2)
Sodium: 142 mmol/L (ref 134–144)
eGFR: 40 mL/min/{1.73_m2} — ABNORMAL LOW (ref >59)

## 2023-08-18 LAB — HEMOGLOBIN A1C
Est. average glucose Bld gHb Est-mCnc: 140 mg/dL
Hgb A1c MFr Bld: 6.5 % — ABNORMAL HIGH (ref 4.8–5.6)

## 2023-08-18 LAB — LIPID PANEL
Chol/HDL Ratio: 3.8 ratio (ref 0.0–4.4)
Cholesterol, Total: 215 mg/dL — ABNORMAL HIGH (ref 100–199)
HDL: 56 mg/dL (ref >39)
LDL Chol Calc (NIH): 135 mg/dL — ABNORMAL HIGH (ref 0–99)
Triglycerides: 134 mg/dL (ref 0–149)
VLDL Cholesterol Cal: 24 mg/dL (ref 5–40)

## 2023-08-18 LAB — VITAMIN D 25 HYDROXY (VIT D DEFICIENCY, FRACTURES): Vit D, 25-Hydroxy: 37.4 ng/mL (ref 30.0–100.0)

## 2023-08-18 LAB — MICROALBUMIN / CREATININE URINE RATIO
Creatinine, Urine: 146.5 mg/dL
Microalb/Creat Ratio: 11 mg/g{creat} (ref 0–29)
Microalbumin, Urine: 16.6 ug/mL

## 2023-08-20 ENCOUNTER — Ambulatory Visit
Admission: RE | Admit: 2023-08-20 | Discharge: 2023-08-20 | Disposition: A | Discharge status: Home / Self Care | Source: Ambulatory Visit | Attending: Family Medicine | Admitting: Family Medicine

## 2023-08-20 ENCOUNTER — Other Ambulatory Visit: Payer: Self-pay | Admitting: Family Medicine

## 2023-08-20 DIAGNOSIS — R928 Other abnormal and inconclusive findings on diagnostic imaging of breast: Secondary | ICD-10-CM

## 2023-08-20 DIAGNOSIS — N6321 Unspecified lump in the left breast, upper outer quadrant: Secondary | ICD-10-CM | POA: Diagnosis not present

## 2023-08-20 DIAGNOSIS — R92333 Mammographic heterogeneous density, bilateral breasts: Secondary | ICD-10-CM | POA: Diagnosis not present

## 2023-08-20 DIAGNOSIS — N6325 Unspecified lump in the left breast, overlapping quadrants: Secondary | ICD-10-CM | POA: Diagnosis not present

## 2023-08-20 DIAGNOSIS — N6002 Solitary cyst of left breast: Secondary | ICD-10-CM | POA: Diagnosis not present

## 2023-08-24 ENCOUNTER — Ambulatory Visit
Admission: RE | Admit: 2023-08-24 | Discharge: 2023-08-24 | Disposition: A | Discharge status: Home / Self Care | Source: Ambulatory Visit | Attending: Family Medicine | Admitting: Family Medicine

## 2023-08-24 DIAGNOSIS — N6002 Solitary cyst of left breast: Secondary | ICD-10-CM | POA: Diagnosis not present

## 2023-08-24 DIAGNOSIS — R928 Other abnormal and inconclusive findings on diagnostic imaging of breast: Secondary | ICD-10-CM | POA: Insufficient documentation

## 2023-08-24 MED ORDER — LIDOCAINE 1 % OPTIME INJ - NO CHARGE
5.0000 mL | Freq: Once | INTRAMUSCULAR | Status: AC
  Administered 2023-08-24: 5 mL
  Filled 2023-08-24: qty 6

## 2023-08-24 MED ORDER — LIDOCAINE-EPINEPHRINE 1 %-1:100000 IJ SOLN
10.0000 mL | Freq: Once | INTRAMUSCULAR | Status: AC
  Administered 2023-08-24: 10 mL
  Filled 2023-08-24: qty 10

## 2023-10-27 DIAGNOSIS — E119 Type 2 diabetes mellitus without complications: Secondary | ICD-10-CM | POA: Diagnosis not present

## 2023-10-27 DIAGNOSIS — Z87891 Personal history of nicotine dependence: Secondary | ICD-10-CM | POA: Diagnosis not present

## 2023-10-27 DIAGNOSIS — I10 Essential (primary) hypertension: Secondary | ICD-10-CM | POA: Diagnosis not present

## 2023-10-27 DIAGNOSIS — R001 Bradycardia, unspecified: Secondary | ICD-10-CM | POA: Diagnosis not present

## 2023-10-27 DIAGNOSIS — E6609 Other obesity due to excess calories: Secondary | ICD-10-CM | POA: Diagnosis not present

## 2023-11-01 ENCOUNTER — Other Ambulatory Visit: Payer: Self-pay | Admitting: Family Medicine

## 2023-11-01 DIAGNOSIS — N183 Chronic kidney disease, stage 3 unspecified: Secondary | ICD-10-CM

## 2023-12-15 ENCOUNTER — Encounter: Payer: Self-pay | Admitting: Family Medicine

## 2023-12-15 ENCOUNTER — Ambulatory Visit: Admitting: Family Medicine

## 2023-12-15 VITALS — BP 114/56 | HR 60 | Resp 14 | Ht 64.0 in | Wt 156.0 lb

## 2023-12-15 DIAGNOSIS — Z23 Encounter for immunization: Secondary | ICD-10-CM | POA: Diagnosis not present

## 2023-12-15 DIAGNOSIS — N1831 Chronic kidney disease, stage 3a: Secondary | ICD-10-CM | POA: Diagnosis not present

## 2023-12-15 DIAGNOSIS — E1122 Type 2 diabetes mellitus with diabetic chronic kidney disease: Secondary | ICD-10-CM

## 2023-12-15 DIAGNOSIS — I10 Essential (primary) hypertension: Secondary | ICD-10-CM | POA: Diagnosis not present

## 2023-12-15 LAB — POCT GLYCOSYLATED HEMOGLOBIN (HGB A1C)
Est. average glucose Bld gHb Est-mCnc: 126
Hemoglobin A1C: 6.6 % — AB (ref 4.0–5.6)

## 2023-12-15 NOTE — Patient Instructions (Addendum)
 Please review the attached list of medications and notify my office if there are any errors.   Please contact your eyecare professional to schedule a routine eye exam   I strongly recommend two doses of Shingrix (the shingles vaccine) separated by 2 to 6 months for adults age 84 years and older. I recommend checking with your insurance plan regarding coverage for this vaccine.

## 2023-12-15 NOTE — Progress Notes (Addendum)
 Established patient visit   Patient: Tracie Gray   DOB: 1940/10/06   83 y.o. Female  MRN: [id number] Visit Date: 12/15/2023  Today's healthcare provider: Nancyann Perry, MD   Chief Complaint  Patient presents with   Diabetes    No eye exam.  Has not been keeping track gets confused at home with her monitor   Hypertension    BP- is doing good with readings   Subjective     Follow up hypertension and diet controlled diabetes, feels well, no complaints. Taking meds consistently.   Lab Results  Component Value Date   HGBA1C 6.6 (A) 12/15/2023   HGBA1C 6.5 (H) 08/17/2023   HGBA1C 6.8 (H) 04/19/2023   BP Readings from Last 3 Encounters:  12/15/23 (!) 114/56  08/17/23 138/60  04/19/23 (!) 146/58   Lab Results  Component Value Date   NA 142 08/17/2023   K 3.6 08/17/2023   CREATININE 1.31 (H) 08/17/2023   EGFR 40 (L) 08/17/2023   GLUCOSE 138 (H) 08/17/2023      Medications: Outpatient Medications Prior to Visit  Medication Sig   aspirin 81 MG tablet Take 81 mg by mouth daily.   Cholecalciferol (VITAMIN D -3) 125 MCG (5000 UT) TABS Take 5,000 Units by mouth once a week.   hydrochlorothiazide  (HYDRODIURIL ) 25 MG tablet TAKE TWO TABLETS (50 MG TOTAL) BY MOUTH DAILY.   hyoscyamine  (ANASPAZ ) 0.125 MG TBDP disintergrating tablet Place 1 tablet (0.125 mg total) under the tongue every 4 (four) hours as needed for cramping (or diarrha).   ONETOUCH ULTRA test strip USE AS DIRECTED TO CHECK BLOOD SUGAR TWICE DAILY   potassium chloride  (KLOR-CON ) 10 MEQ tablet TAKE ONE TABLET (10 MEQ TOTAL) BY MOUTH DAILY.   valsartan  (DIOVAN ) 160 MG tablet TAKE ONE TABLET BY MOUTH ONCE A DAY   No facility-administered medications prior to visit.    Review of Systems  Constitutional:  Negative for appetite change, chills, fatigue and fever.  Respiratory:  Negative for chest tightness and shortness of breath.   Cardiovascular:  Negative for chest pain and palpitations.   Gastrointestinal:  Negative for abdominal pain, nausea and vomiting.  Neurological:  Negative for dizziness and weakness.       Objective    BP (!) 114/56   Pulse 60   Resp 14   Ht 5' 4 (1.626 m)   Wt 156 lb (70.8 kg)   SpO2 99%   BMI 26.78 kg/m    Physical Exam   General: Appearance:    Well developed, well nourished female in no acute distress  Eyes:    PERRL, conjunctiva/corneas clear, EOM's intact       Lungs:     Clear to auscultation bilaterally, respirations unlabored  Heart:    Normal heart rate. Normal rhythm. No murmurs, rubs, or gallops.    MS:   All extremities are intact.    Neurologic:   Awake, alert, oriented x 3. No apparent focal neurological defect.         Results for orders placed or performed in visit on 12/15/23  POCT glycosylated hemoglobin (Hb A1C)  Result Value Ref Range   Hemoglobin A1C 6.6 (A) 4.0 - 5.6 %   Est. average glucose Bld gHb Est-mCnc 126     Assessment & Plan     1. Type 2 diabetes mellitus with stage 3a chronic kidney disease, without long-term current use of insulin (HCC) (Primary) A1c remains well controlled with diet. CKD is stable, is  on ACEI, encourage abundant fluid intake.  Her eye doctor has retired and she is planning on making appointment to see a new one. She declined referral.   2. Primary hypertension Well controlled.  Continue current medications.    3. Immunization due  - Flu vaccine HIGH DOSE PF(Fluzone Trivalent)   Return in about 20 weeks (around 05/03/2024) for Diabetes.         Nancyann Perry, MD  [facility] [phone] (phone) [phone] (fax)  [facility]

## 2024-01-27 ENCOUNTER — Other Ambulatory Visit: Payer: Self-pay | Admitting: Family Medicine

## 2024-01-27 DIAGNOSIS — I129 Hypertensive chronic kidney disease with stage 1 through stage 4 chronic kidney disease, or unspecified chronic kidney disease: Secondary | ICD-10-CM

## 2024-05-03 ENCOUNTER — Ambulatory Visit: Admitting: Family Medicine

## 2024-07-26 ENCOUNTER — Ambulatory Visit
